# Patient Record
Sex: Female | Born: 1970 | Race: Black or African American | Hispanic: No | Marital: Married | Smoking: Never smoker
Health system: Southern US, Community
[De-identification: ages and names within clinical notes are randomized; demographics above are authoritative.]

## PROBLEM LIST (undated history)

## (undated) DIAGNOSIS — G5600 Carpal tunnel syndrome, unspecified upper limb: Secondary | ICD-10-CM

## (undated) DIAGNOSIS — C801 Malignant (primary) neoplasm, unspecified: Secondary | ICD-10-CM

## (undated) DIAGNOSIS — M1990 Unspecified osteoarthritis, unspecified site: Secondary | ICD-10-CM

## (undated) HISTORY — DX: Malignant (primary) neoplasm, unspecified: C80.1

## (undated) HISTORY — PX: BREAST SURGERY: SHX581

## (undated) HISTORY — DX: Carpal tunnel syndrome, unspecified upper limb: G56.00

## (undated) HISTORY — DX: Unspecified osteoarthritis, unspecified site: M19.90

---

## 2000-02-22 ENCOUNTER — Other Ambulatory Visit: Admission: RE | Admit: 2000-02-22 | Discharge: 2000-02-22 | Payer: Self-pay | Admitting: *Deleted

## 2001-03-13 ENCOUNTER — Other Ambulatory Visit: Admission: RE | Admit: 2001-03-13 | Discharge: 2001-03-13 | Payer: Self-pay | Admitting: *Deleted

## 2002-01-06 ENCOUNTER — Emergency Department (HOSPITAL_COMMUNITY): Admission: EM | Admit: 2002-01-06 | Discharge: 2002-01-06 | Payer: Self-pay | Admitting: Emergency Medicine

## 2002-01-06 ENCOUNTER — Encounter: Payer: Self-pay | Admitting: Emergency Medicine

## 2002-03-17 ENCOUNTER — Other Ambulatory Visit: Admission: RE | Admit: 2002-03-17 | Discharge: 2002-03-17 | Payer: Self-pay | Admitting: *Deleted

## 2003-03-19 ENCOUNTER — Other Ambulatory Visit: Admission: RE | Admit: 2003-03-19 | Discharge: 2003-03-19 | Payer: Self-pay | Admitting: *Deleted

## 2003-09-14 ENCOUNTER — Ambulatory Visit (HOSPITAL_COMMUNITY): Admission: RE | Admit: 2003-09-14 | Discharge: 2003-09-14 | Payer: Self-pay | Admitting: Family Medicine

## 2010-04-11 ENCOUNTER — Encounter: Admission: RE | Admit: 2010-04-11 | Discharge: 2010-04-11 | Payer: Self-pay | Admitting: Family Medicine

## 2013-01-19 ENCOUNTER — Other Ambulatory Visit: Payer: Self-pay

## 2013-01-19 DIAGNOSIS — Z1231 Encounter for screening mammogram for malignant neoplasm of breast: Secondary | ICD-10-CM

## 2013-02-13 ENCOUNTER — Ambulatory Visit
Admission: RE | Admit: 2013-02-13 | Discharge: 2013-02-13 | Disposition: A | Discharge status: Home / Self Care | Payer: BC Managed Care – PPO | Source: Ambulatory Visit

## 2013-02-13 DIAGNOSIS — Z1231 Encounter for screening mammogram for malignant neoplasm of breast: Secondary | ICD-10-CM

## 2014-01-12 ENCOUNTER — Other Ambulatory Visit: Payer: Self-pay

## 2014-01-12 DIAGNOSIS — Z1231 Encounter for screening mammogram for malignant neoplasm of breast: Secondary | ICD-10-CM

## 2014-02-19 ENCOUNTER — Ambulatory Visit
Admission: RE | Admit: 2014-02-19 | Discharge: 2014-02-19 | Disposition: A | Discharge status: Home / Self Care | Payer: BC Managed Care – PPO | Source: Ambulatory Visit

## 2014-02-19 DIAGNOSIS — Z1231 Encounter for screening mammogram for malignant neoplasm of breast: Secondary | ICD-10-CM

## 2015-01-18 ENCOUNTER — Other Ambulatory Visit: Payer: Self-pay

## 2015-01-18 DIAGNOSIS — Z1231 Encounter for screening mammogram for malignant neoplasm of breast: Secondary | ICD-10-CM

## 2015-02-21 ENCOUNTER — Ambulatory Visit
Admission: RE | Admit: 2015-02-21 | Discharge: 2015-02-21 | Disposition: A | Discharge status: Home / Self Care | Payer: BC Managed Care – PPO | Source: Ambulatory Visit

## 2015-02-21 DIAGNOSIS — Z1231 Encounter for screening mammogram for malignant neoplasm of breast: Secondary | ICD-10-CM

## 2016-01-30 ENCOUNTER — Other Ambulatory Visit: Payer: Self-pay | Admitting: Obstetrics & Gynecology

## 2016-01-30 DIAGNOSIS — Z1231 Encounter for screening mammogram for malignant neoplasm of breast: Secondary | ICD-10-CM

## 2016-02-23 ENCOUNTER — Ambulatory Visit
Admission: RE | Admit: 2016-02-23 | Discharge: 2016-02-23 | Disposition: A | Discharge status: Home / Self Care | Payer: BC Managed Care – PPO | Source: Ambulatory Visit | Attending: Obstetrics & Gynecology | Admitting: Obstetrics & Gynecology

## 2016-02-23 DIAGNOSIS — Z1231 Encounter for screening mammogram for malignant neoplasm of breast: Secondary | ICD-10-CM

## 2017-01-18 ENCOUNTER — Other Ambulatory Visit: Payer: Self-pay | Admitting: Obstetrics & Gynecology

## 2017-01-18 DIAGNOSIS — Z1231 Encounter for screening mammogram for malignant neoplasm of breast: Secondary | ICD-10-CM

## 2017-03-01 ENCOUNTER — Ambulatory Visit
Admission: RE | Admit: 2017-03-01 | Discharge: 2017-03-01 | Disposition: A | Discharge status: Home / Self Care | Payer: BC Managed Care – PPO | Source: Ambulatory Visit | Attending: Obstetrics & Gynecology | Admitting: Obstetrics & Gynecology

## 2017-03-01 DIAGNOSIS — Z1231 Encounter for screening mammogram for malignant neoplasm of breast: Secondary | ICD-10-CM

## 2017-06-05 ENCOUNTER — Other Ambulatory Visit: Payer: Self-pay

## 2017-06-10 ENCOUNTER — Other Ambulatory Visit: Payer: Self-pay | Admitting: *Deleted

## 2017-06-10 MED ORDER — ETONOGESTREL-ETHINYL ESTRADIOL 0.12-0.015 MG/24HR VA RING: 1.0000 | VAGINAL_RING | VAGINAL | 0 refills | Status: DC

## 2017-06-10 NOTE — Telephone Encounter (Signed)
Pt annual at [facility] on 05/01/2016, 1 ring sent. Needs annual exam

## 2017-06-12 ENCOUNTER — Other Ambulatory Visit: Payer: Self-pay

## 2017-06-12 MED ORDER — ETONOGESTREL-ETHINYL ESTRADIOL 0.12-0.015 MG/24HR VA RING: 1.0000 | VAGINAL_RING | VAGINAL | 4 refills | Status: DC

## 2017-06-12 NOTE — Telephone Encounter (Signed)
Requests 90 days supply per pharmacy fax.

## 2018-01-21 ENCOUNTER — Other Ambulatory Visit: Payer: Self-pay | Admitting: Obstetrics & Gynecology

## 2018-01-21 DIAGNOSIS — Z1231 Encounter for screening mammogram for malignant neoplasm of breast: Secondary | ICD-10-CM

## 2018-03-03 ENCOUNTER — Ambulatory Visit
Admission: RE | Admit: 2018-03-03 | Discharge: 2018-03-03 | Disposition: A | Discharge status: Home / Self Care | Payer: BC Managed Care – PPO | Source: Ambulatory Visit | Attending: Obstetrics & Gynecology | Admitting: Obstetrics & Gynecology

## 2018-03-03 DIAGNOSIS — Z1231 Encounter for screening mammogram for malignant neoplasm of breast: Secondary | ICD-10-CM

## 2018-03-04 ENCOUNTER — Other Ambulatory Visit: Payer: Self-pay | Admitting: Obstetrics & Gynecology

## 2018-03-04 DIAGNOSIS — R928 Other abnormal and inconclusive findings on diagnostic imaging of breast: Secondary | ICD-10-CM

## 2018-03-14 ENCOUNTER — Ambulatory Visit
Admission: RE | Admit: 2018-03-14 | Discharge: 2018-03-14 | Disposition: A | Discharge status: Home / Self Care | Payer: BC Managed Care – PPO | Source: Ambulatory Visit | Attending: Obstetrics & Gynecology | Admitting: Obstetrics & Gynecology

## 2018-03-14 ENCOUNTER — Other Ambulatory Visit: Payer: Self-pay | Admitting: Obstetrics & Gynecology

## 2018-03-14 DIAGNOSIS — R928 Other abnormal and inconclusive findings on diagnostic imaging of breast: Secondary | ICD-10-CM

## 2018-03-14 DIAGNOSIS — N6310 Unspecified lump in the right breast, unspecified quadrant: Secondary | ICD-10-CM

## 2018-03-28 ENCOUNTER — Ambulatory Visit
Admission: RE | Admit: 2018-03-28 | Discharge: 2018-03-28 | Disposition: A | Discharge status: Home / Self Care | Payer: BC Managed Care – PPO | Source: Ambulatory Visit | Attending: Obstetrics & Gynecology | Admitting: Obstetrics & Gynecology

## 2018-03-28 DIAGNOSIS — N6310 Unspecified lump in the right breast, unspecified quadrant: Secondary | ICD-10-CM

## 2018-03-28 HISTORY — PX: BREAST BIOPSY: SHX20

## 2018-04-23 ENCOUNTER — Encounter: Payer: Self-pay | Admitting: Obstetrics & Gynecology

## 2018-04-23 ENCOUNTER — Ambulatory Visit: Payer: BC Managed Care – PPO | Admitting: Obstetrics & Gynecology

## 2018-04-23 VITALS — BP 128/84 | Ht 61.5 in | Wt 181.0 lb

## 2018-04-23 DIAGNOSIS — Z6833 Body mass index (BMI) 33.0-33.9, adult: Secondary | ICD-10-CM

## 2018-04-23 DIAGNOSIS — Z3044 Encounter for surveillance of vaginal ring hormonal contraceptive device: Secondary | ICD-10-CM

## 2018-04-23 DIAGNOSIS — E6609 Other obesity due to excess calories: Secondary | ICD-10-CM | POA: Diagnosis not present

## 2018-04-23 DIAGNOSIS — Z01419 Encounter for gynecological examination (general) (routine) without abnormal findings: Secondary | ICD-10-CM

## 2018-04-23 MED ORDER — ETONOGESTREL-ETHINYL ESTRADIOL 0.12-0.015 MG/24HR VA RING: 1.0000 | VAGINAL_RING | VAGINAL | 4 refills | Status: DC

## 2018-04-23 NOTE — Progress Notes (Signed)
Leslie Mccormick 1971-07-01 [id number]   History:    47 y.o. G0 Single.  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  RP:  Established patient presenting for annual gyn exam   HPI:  Well on Nuvaring.  No BTB.  No pelvic pain.  Abstinent.  Breast normal.  Had a right breast biopsy March 28, 2018 that came back benign fibrocystic changes.  Urine and bowel movements normal.  Body mass index 33.65.  Exercises regularly.  Health labs with family physician.  Past medical history,surgical history, family history and social history were all reviewed and documented in the EPIC chart.  Gynecologic History Patient's last menstrual period was 03/20/2018. Contraception: abstinence and NuvaRing vaginal inserts Last Pap: 2017-2018 [facility]. Results were: Normal per patient Last mammogram: 02/2018. Results were: abnormal.  Rt breast Bx benign fibrocystic changes 03/28/2018 Bone Density: Never Colonoscopy: Never  Obstetric History OB History  Gravida Para Term Preterm AB Living  0 0 0 0 0 0  SAB TAB Ectopic Multiple Live Births  0 0 0 0 0     ROS: A ROS was performed and pertinent positives and negatives are included in the history.  GENERAL: No fevers or chills. HEENT: No change in vision, no earache, sore throat or sinus congestion. NECK: No pain or stiffness. CARDIOVASCULAR: No chest pain or pressure. No palpitations. PULMONARY: No shortness of breath, cough or wheeze. GASTROINTESTINAL: No abdominal pain, nausea, vomiting or diarrhea, melena or bright red blood per rectum. GENITOURINARY: No urinary frequency, urgency, hesitancy or dysuria. MUSCULOSKELETAL: No joint or muscle pain, no back pain, no recent trauma. DERMATOLOGIC: No rash, no itching, no lesions. ENDOCRINE: No polyuria, polydipsia, no heat or cold intolerance. No recent change in weight. HEMATOLOGICAL: No anemia or easy bruising or bleeding. NEUROLOGIC: No headache, seizures, numbness, tingling or weakness.  PSYCHIATRIC: No depression, no loss of interest in normal activity or change in sleep pattern.     Exam:   BP 128/84   Ht 5' 1.5" (1.562 m)   Wt 181 lb (82.1 kg)   LMP 03/20/2018 Comment: nuvaring  BMI 33.65 kg/m   Body mass index is 33.65 kg/m.  General appearance : Well developed well nourished female. No acute distress HEENT: Eyes: no retinal hemorrhage or exudates,  Neck supple, trachea midline, no carotid bruits, no thyroidmegaly Lungs: Clear to auscultation, no rhonchi or wheezes, or rib retractions  Heart: Regular rate and rhythm, no murmurs or gallops Breast:Examined in sitting and supine position were symmetrical in appearance, no palpable masses or tenderness,  no skin retraction, no nipple inversion, no nipple discharge, no skin discoloration, no axillary or supraclavicular lymphadenopathy Abdomen: no palpable masses or tenderness, no rebound or guarding Extremities: no edema or skin discoloration or tenderness  Pelvic: Vulva: Normal             Vagina: No gross lesions or discharge  Cervix: No gross lesions or discharge.  Pap reflex done  Uterus  AV, normal size, shape and consistency, non-tender and mobile  Adnexa  Without masses or tenderness  Anus: Normal   Assessment/Plan:  47 y.o. female for annual exam   1. Encounter for routine gynecological examination with Papanicolaou smear of cervix Normal gynecologic exam.  Pap reflex done.  Breast exam normal.  Right breast biopsy on March 28, 2018 showed benign fibrocystic changes.  Health labs with family physician.  2. Encounter for surveillance of vaginal ring hormonal contraceptive device Well on NuvaRing.  No contraindication.  NuvaRing  represcribed.     3. Class 1 obesity due to excess calories without serious comorbidity with body mass index (BMI) of 33.0 to 33.9 in adult Low calorie/carb diet such as [employer] recommended.  Continue with aerobic physical activities 5 times a week and weightlifting  every 2 days.  Other orders - etonogestrel-ethinyl estradiol (NUVARING) 0.12-0.015 MG/24HR vaginal ring; Place 1 each vaginally every 28 (twenty-eight) days. Insert vaginally and leave in place for 3 consecutive weeks, then remove for 1 week.  [name] MD, 3:11 PM 04/23/2018

## 2018-04-23 NOTE — Patient Instructions (Signed)
1. Encounter for routine gynecological examination with Papanicolaou smear of cervix Normal gynecologic exam.  Pap reflex done.  Breast exam normal.  Right breast biopsy on March 28, 2018 showed benign fibrocystic changes.  Health labs with family physician.  2. Encounter for surveillance of vaginal ring hormonal contraceptive device Well on NuvaRing.  No contraindication.  NuvaRing represcribed.     3. Class 1 obesity due to excess calories without serious comorbidity with body mass index (BMI) of 33.0 to 33.9 in adult Low calorie/carb diet such as [employer] recommended.  Continue with aerobic physical activities 5 times a week and weightlifting every 2 days.  Other orders - etonogestrel-ethinyl estradiol (NUVARING) 0.12-0.015 MG/24HR vaginal ring; Place 1 each vaginally every 28 (twenty-eight) days. Insert vaginally and leave in place for 3 consecutive weeks, then remove for 1 week.  Leslie Mccormick, it was a pleasure seeing you today!  I will inform you of your results as soon as they are available.  All my congratulations on your doctorate.

## 2018-04-23 NOTE — Addendum Note (Signed)
Addended by: Thurnell Garbe A on: 04/23/2018 04:29 PM   Modules accepted: Orders

## 2018-04-25 LAB — PAP IG W/ RFLX HPV ASCU

## 2019-01-22 ENCOUNTER — Other Ambulatory Visit: Payer: Self-pay | Admitting: Obstetrics & Gynecology

## 2019-01-22 DIAGNOSIS — Z1231 Encounter for screening mammogram for malignant neoplasm of breast: Secondary | ICD-10-CM

## 2019-03-06 ENCOUNTER — Ambulatory Visit
Admission: RE | Admit: 2019-03-06 | Discharge: 2019-03-06 | Disposition: A | Discharge status: Home / Self Care | Payer: BC Managed Care – PPO | Source: Ambulatory Visit | Attending: Obstetrics & Gynecology | Admitting: Obstetrics & Gynecology

## 2019-03-06 ENCOUNTER — Other Ambulatory Visit: Payer: Self-pay

## 2019-03-06 DIAGNOSIS — Z1231 Encounter for screening mammogram for malignant neoplasm of breast: Secondary | ICD-10-CM

## 2019-04-09 ENCOUNTER — Telehealth: Payer: Self-pay | Admitting: *Deleted

## 2019-04-09 NOTE — Telephone Encounter (Signed)
Patient called stating her nuvaring is $167 and she doesn't want to pay this price. After discussing with patient it appears she wants the brand and not the generic ring. I explained to her I can send in the brand nuvaring,however I can't make the price cheaper she will need to discuss with the insurance company. Patient verbalized she understood.

## 2019-04-25 ENCOUNTER — Other Ambulatory Visit: Payer: Self-pay | Admitting: Obstetrics & Gynecology

## 2019-04-27 ENCOUNTER — Other Ambulatory Visit: Payer: Self-pay

## 2019-04-27 MED ORDER — NUVARING 0.12-0.015 MG/24HR VA RING: VAGINAL_RING | VAGINAL | 0 refills | Status: DC

## 2019-04-29 ENCOUNTER — Encounter: Payer: Self-pay | Admitting: Obstetrics & Gynecology

## 2019-04-29 ENCOUNTER — Ambulatory Visit (INDEPENDENT_AMBULATORY_CARE_PROVIDER_SITE_OTHER): Payer: BC Managed Care – PPO | Admitting: Obstetrics & Gynecology

## 2019-04-29 ENCOUNTER — Other Ambulatory Visit: Payer: Self-pay

## 2019-04-29 VITALS — BP 120/72 | Ht 62.0 in | Wt 186.0 lb

## 2019-04-29 DIAGNOSIS — Z6834 Body mass index (BMI) 34.0-34.9, adult: Secondary | ICD-10-CM

## 2019-04-29 DIAGNOSIS — Z01419 Encounter for gynecological examination (general) (routine) without abnormal findings: Secondary | ICD-10-CM

## 2019-04-29 DIAGNOSIS — E6609 Other obesity due to excess calories: Secondary | ICD-10-CM

## 2019-04-29 DIAGNOSIS — Z3044 Encounter for surveillance of vaginal ring hormonal contraceptive device: Secondary | ICD-10-CM | POA: Diagnosis not present

## 2019-04-29 DIAGNOSIS — E66811 Obesity, class 1: Secondary | ICD-10-CM

## 2019-04-29 NOTE — Progress Notes (Signed)
Leslie Mccormick February 01, 1971 [id number]   History:    48 y.o. G0 Single.  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  RP:  Established patient presenting for annual gyn exam   HPI: Well on Nuvaring, but expensive.  Light menses.  No pelvic pain.  Abstinent.  Urine/BMs normal.  Breasts normal.  BMI 34.02.  Exercising regularly.  Health Labs with Fam MD.  Past medical history,surgical history, family history and social history were all reviewed and documented in the EPIC chart.  Gynecologic History No LMP recorded. (Menstrual status: Other). Contraception: NuvaRing vaginal inserts Last Pap: 04/2018. Results were: Negative Last mammogram: 02/2019 Negative Bone Density: Never Colonoscopy: Never  Obstetric History OB History  Gravida Para Term Preterm AB Living  0 0 0 0 0 0  SAB TAB Ectopic Multiple Live Births  0 0 0 0 0     ROS: A ROS was performed and pertinent positives and negatives are included in the history.  GENERAL: No fevers or chills. HEENT: No change in vision, no earache, sore throat or sinus congestion. NECK: No pain or stiffness. CARDIOVASCULAR: No chest pain or pressure. No palpitations. PULMONARY: No shortness of breath, cough or wheeze. GASTROINTESTINAL: No abdominal pain, nausea, vomiting or diarrhea, melena or bright red blood per rectum. GENITOURINARY: No urinary frequency, urgency, hesitancy or dysuria. MUSCULOSKELETAL: No joint or muscle pain, no back pain, no recent trauma. DERMATOLOGIC: No rash, no itching, no lesions. ENDOCRINE: No polyuria, polydipsia, no heat or cold intolerance. No recent change in weight. HEMATOLOGICAL: No anemia or easy bruising or bleeding. NEUROLOGIC: No headache, seizures, numbness, tingling or weakness. PSYCHIATRIC: No depression, no loss of interest in normal activity or change in sleep pattern.     Exam:   BP 120/72   Ht 5\' 2"  (1.575 m)   Wt 186 lb (84.4 kg)   BMI 34.02 kg/m   Body mass index is 34.02 kg/m.  General  appearance : Well developed well nourished female. No acute distress HEENT: Eyes: no retinal hemorrhage or exudates,  Neck supple, trachea midline, no carotid bruits, no thyroidmegaly Lungs: Clear to auscultation, no rhonchi or wheezes, or rib retractions  Heart: Regular rate and rhythm, no murmurs or gallops Breast:Examined in sitting and supine position were symmetrical in appearance, no palpable masses or tenderness,  no skin retraction, no nipple inversion, no nipple discharge, no skin discoloration, no axillary or supraclavicular lymphadenopathy Abdomen: no palpable masses or tenderness, no rebound or guarding Extremities: no edema or skin discoloration or tenderness  Pelvic: Vulva: Normal             Vagina: No gross lesions or discharge  Cervix: No gross lesions or discharge.  Pap reflex done.  Uterus  AV, normal size, shape and consistency, non-tender and mobile  Adnexa  Without masses or tenderness  Anus: Normal   Assessment/Plan:  48 y.o. female for annual exam   1. Encounter for routine gynecological examination with Papanicolaou smear of cervix Normal gynecologic exam.  Pap reflex done.  Breast exam normal.  Screening mammogram August 2020 was negative.  Health labs with family physician.  2. Encounter for surveillance of vaginal ring hormonal contraceptive device Well on NuvaRing.  Patient will try to change pharmacy because of high cost.  No contraindication to continue on NuvaRing.  Prescription already sent for the year.  3. Class 1 obesity due to excess calories without serious comorbidity with body mass index (BMI) of 34.0 to 34.9 in adult Recommend a lower calorie/carb diet such  as [employer].  Aerobic physical activities 5 times a week and weightlifting every 2 days.  Other orders - cholecalciferol (VITAMIN D3) 25 MCG (1000 UT) tablet; Take 1,000 Units by mouth daily.  [name] MD, 3:08 PM 04/29/2019

## 2019-04-30 ENCOUNTER — Encounter: Payer: Self-pay | Admitting: Obstetrics & Gynecology

## 2019-04-30 NOTE — Patient Instructions (Signed)
1. Encounter for routine gynecological examination with Papanicolaou smear of cervix Normal gynecologic exam.  Pap reflex done.  Breast exam normal.  Screening mammogram August 2020 was negative.  Health labs with family physician.  2. Encounter for surveillance of vaginal ring hormonal contraceptive device Well on NuvaRing.  Patient will try to change pharmacy because of high cost.  No contraindication to continue on NuvaRing.  Prescription already sent for the year.  3. Class 1 obesity due to excess calories without serious comorbidity with body mass index (BMI) of 34.0 to 34.9 in adult Recommend a lower calorie/carb diet such as [employer].  Aerobic physical activities 5 times a week and weightlifting every 2 days.  Other orders - cholecalciferol (VITAMIN D3) 25 MCG (1000 UT) tablet; Take 1,000 Units by mouth daily.  [place], it was a pleasure seeing you today!  I will inform you of your results as soon as they are available.

## 2019-05-05 LAB — PAP IG W/ RFLX HPV ASCU

## 2019-05-05 LAB — HUMAN PAPILLOMAVIRUS, HIGH RISK: HPV DNA High Risk: NOT DETECTED

## 2019-07-27 ENCOUNTER — Telehealth: Payer: Self-pay | Admitting: *Deleted

## 2019-07-27 NOTE — Telephone Encounter (Signed)
PA done via cover my meds for brand nuvaring 0.12-0.015 mg ring, will wait for response from [facility].

## 2019-07-28 NOTE — Telephone Encounter (Signed)
[facility] denied Rx for brand nuvaring per plan must try and fail both generic nuvaring and Annovera.  Pharmacy informed as well.

## 2019-10-23 ENCOUNTER — Other Ambulatory Visit: Payer: Self-pay | Admitting: Obstetrics & Gynecology

## 2020-01-26 ENCOUNTER — Other Ambulatory Visit: Payer: Self-pay | Admitting: Obstetrics & Gynecology

## 2020-01-26 DIAGNOSIS — Z1231 Encounter for screening mammogram for malignant neoplasm of breast: Secondary | ICD-10-CM

## 2020-03-11 ENCOUNTER — Ambulatory Visit: Payer: BC Managed Care – PPO

## 2020-03-14 ENCOUNTER — Other Ambulatory Visit: Payer: Self-pay

## 2020-03-14 MED ORDER — NUVARING 0.12-0.015 MG/24HR VA RING: VAGINAL_RING | VAGINAL | 2 refills | Status: DC

## 2020-04-29 ENCOUNTER — Other Ambulatory Visit: Payer: Self-pay

## 2020-04-29 ENCOUNTER — Ambulatory Visit (INDEPENDENT_AMBULATORY_CARE_PROVIDER_SITE_OTHER): Payer: BC Managed Care – PPO | Admitting: Obstetrics & Gynecology

## 2020-04-29 ENCOUNTER — Encounter: Payer: Self-pay | Admitting: Obstetrics & Gynecology

## 2020-04-29 VITALS — BP 134/80 | Ht 62.0 in | Wt 176.0 lb

## 2020-04-29 DIAGNOSIS — Z3044 Encounter for surveillance of vaginal ring hormonal contraceptive device: Secondary | ICD-10-CM

## 2020-04-29 DIAGNOSIS — E6609 Other obesity due to excess calories: Secondary | ICD-10-CM

## 2020-04-29 DIAGNOSIS — Z01419 Encounter for gynecological examination (general) (routine) without abnormal findings: Secondary | ICD-10-CM | POA: Diagnosis not present

## 2020-04-29 DIAGNOSIS — R87610 Atypical squamous cells of undetermined significance on cytologic smear of cervix (ASC-US): Secondary | ICD-10-CM | POA: Diagnosis not present

## 2020-04-29 DIAGNOSIS — Z6832 Body mass index (BMI) 32.0-32.9, adult: Secondary | ICD-10-CM

## 2020-04-29 MED ORDER — NUVARING 0.12-0.015 MG/24HR VA RING
VAGINAL_RING | VAGINAL | 4 refills | Status: DC
Start: 2020-04-29 — End: 2020-10-05

## 2020-04-29 NOTE — Progress Notes (Signed)
Leslie Mccormick [birth year] [id number]   History:    49 y.o. G0 Married.  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  RP:  Established patient presenting for annual gyn exam   HPI: Well on Nuvaring continuously q4 wks.  No BTB.  No pelvic pain.  Abstinent.  Urine/BMs normal.  Intolerance to Gluten.  Breasts normal.  BMI 32.19.  Exercising regularly.  Health Labs with Fam MD.  Past medical history,surgical history, family history and social history were all reviewed and documented in the EPIC chart.  Gynecologic History No LMP recorded (lmp unknown).  Obstetric History OB History  Gravida Para Term Preterm AB Living  0 0 0 0 0 0  SAB TAB Ectopic Multiple Live Births  0 0 0 0 0     ROS: A ROS was performed and pertinent positives and negatives are included in the history. GENERAL: No fevers or chills. HEENT: No change in vision, no earache, sore throat or sinus congestion. NECK: No pain or stiffness. CARDIOVASCULAR: No chest pain or pressure. No palpitations. PULMONARY: No shortness of breath, cough or wheeze. GASTROINTESTINAL: No abdominal pain, nausea, vomiting or diarrhea, melena or bright red blood per rectum. GENITOURINARY: No urinary frequency, urgency, hesitancy or dysuria. MUSCULOSKELETAL: No joint or muscle pain, no back pain, no recent trauma. DERMATOLOGIC: No rash, no itching, no lesions. ENDOCRINE: No polyuria, polydipsia, no heat or cold intolerance. No recent change in weight. HEMATOLOGICAL: No anemia or easy bruising or bleeding. NEUROLOGIC: No headache, seizures, numbness, tingling or weakness. PSYCHIATRIC: No depression, no loss of interest in normal activity or change in sleep pattern.     Exam:   BP 134/80 (BP Location: Right Arm, Patient Position: Sitting, Cuff Size: Normal)   Ht 5\' 2"  (1.575 m)   Wt 176 lb (79.8 kg)   LMP  (LMP Unknown)   BMI 32.19 kg/m   Body mass index is 32.19 kg/m.  General appearance : Well developed well nourished female.  No acute distress HEENT: Eyes: no retinal hemorrhage or exudates,  Neck supple, trachea midline, no carotid bruits, no thyroidmegaly Lungs: Clear to auscultation, no rhonchi or wheezes, or rib retractions  Heart: Regular rate and rhythm, no murmurs or gallops Breast:Examined in sitting and supine position were symmetrical in appearance, no palpable masses or tenderness,  no skin retraction, no nipple inversion, no nipple discharge, no skin discoloration, no axillary or supraclavicular lymphadenopathy Abdomen: no palpable masses or tenderness, no rebound or guarding Extremities: no edema or skin discoloration or tenderness  Pelvic: Vulva: Normal             Vagina: No gross lesions or discharge  Cervix: No gross lesions or discharge.  Pap reflex done.  Uterus  AV, normal size, shape and consistency, non-tender and mobile  Adnexa  Without masses or tenderness  Anus: Normal   Assessment/Plan:  49 y.o. female for annual exam   1. Encounter for routine gynecological examination with Papanicolaou smear of cervix Normal gynecologic exam.  Pap reflex done.  Breast exam normal.  Screening mammogram schedule October 2021.  Health labs with family physician.  2. ASCUS of cervix with negative high risk HPV Pap reflex done today.  3. Encounter for surveillance of vaginal ring hormonal contraceptive device Well on NuvaRing continuously.  No contraindication.  Prescription sent to pharmacy.  4. Class 1 obesity due to excess calories without serious comorbidity with body mass index (BMI) of 32.0 to 32.9 in adult Recommend a lower calorie/carb diet.  Continue with fitness and weight lifting.  Other orders - NUVARING 0.12-0.015 MG/24HR vaginal ring; Insert vaginally and leave in place for 4 consecutive weeks, then switch Ring.  [name] MD, 3:59 PM 04/29/2020

## 2020-05-02 NOTE — Addendum Note (Signed)
Addended by: Lorine Bears on: 05/02/2020 09:56 AM   Modules accepted: Orders

## 2020-05-04 LAB — PAP IG W/ RFLX HPV ASCU

## 2020-05-16 ENCOUNTER — Other Ambulatory Visit: Payer: Self-pay

## 2020-05-16 ENCOUNTER — Ambulatory Visit
Admission: RE | Admit: 2020-05-16 | Discharge: 2020-05-16 | Disposition: A | Discharge status: Home / Self Care | Payer: BC Managed Care – PPO | Source: Ambulatory Visit | Attending: Obstetrics & Gynecology | Admitting: Obstetrics & Gynecology

## 2020-05-16 DIAGNOSIS — Z1231 Encounter for screening mammogram for malignant neoplasm of breast: Secondary | ICD-10-CM

## 2020-05-18 ENCOUNTER — Other Ambulatory Visit: Payer: Self-pay | Admitting: Obstetrics & Gynecology

## 2020-05-18 DIAGNOSIS — R928 Other abnormal and inconclusive findings on diagnostic imaging of breast: Secondary | ICD-10-CM

## 2020-06-01 ENCOUNTER — Ambulatory Visit
Admission: RE | Admit: 2020-06-01 | Discharge: 2020-06-01 | Disposition: A | Discharge status: Home / Self Care | Payer: BC Managed Care – PPO | Source: Ambulatory Visit | Attending: Obstetrics & Gynecology | Admitting: Obstetrics & Gynecology

## 2020-06-01 ENCOUNTER — Other Ambulatory Visit: Payer: Self-pay

## 2020-06-01 DIAGNOSIS — R928 Other abnormal and inconclusive findings on diagnostic imaging of breast: Secondary | ICD-10-CM

## 2020-07-11 ENCOUNTER — Telehealth: Payer: Self-pay

## 2020-07-11 NOTE — Telephone Encounter (Signed)
Patient called. She had seen Dr. Marlou Starks today in regards to a breast mass that has enlarged since last year. On diagnostic breast studies it was "suspicious" and the radiology recommended excision of the entire mass.  Patient saw Dr. Marlou Starks and he also recommended excision of the entire mass.  Patient was trying to avoid surgery and asked him for options that included:  MRI now, Wait 6 mos and recheck with imaging, Biopsy now.  Patient wanted me to ask Dr. Marguerita Merles what she would recommend. I told her since the  Mass had enlarged and both MD's recommended removal of the entire mass that I felt certain Dr. Marguerita Merles would be in agreement with them. I encouraged her not to be afraid of surgery and to consider.

## 2020-07-12 ENCOUNTER — Ambulatory Visit: Payer: Self-pay | Admitting: General Surgery

## 2020-07-12 DIAGNOSIS — N6310 Unspecified lump in the right breast, unspecified quadrant: Secondary | ICD-10-CM

## 2020-07-12 NOTE — Telephone Encounter (Signed)
Spoke with patient and advised. She said after we talked yesterday she called the surgeon's office and is in process of arranging surgery.

## 2020-07-12 NOTE — Telephone Encounter (Signed)
Yes, I strongly recommend removal of the mass now.

## 2020-07-12 NOTE — Telephone Encounter (Signed)
Left message for patient to call.

## 2020-07-26 ENCOUNTER — Other Ambulatory Visit: Payer: Self-pay | Admitting: General Surgery

## 2020-07-26 DIAGNOSIS — N6310 Unspecified lump in the right breast, unspecified quadrant: Secondary | ICD-10-CM

## 2020-09-08 ENCOUNTER — Encounter (HOSPITAL_BASED_OUTPATIENT_CLINIC_OR_DEPARTMENT_OTHER): Payer: Self-pay | Admitting: General Surgery

## 2020-09-08 ENCOUNTER — Other Ambulatory Visit: Payer: Self-pay

## 2020-09-10 ENCOUNTER — Other Ambulatory Visit (HOSPITAL_COMMUNITY)
Admission: RE | Admit: 2020-09-10 | Discharge: 2020-09-10 | Disposition: A | Discharge status: Home / Self Care | Payer: BC Managed Care – PPO | Source: Ambulatory Visit | Attending: General Surgery | Admitting: General Surgery

## 2020-09-10 DIAGNOSIS — Z01812 Encounter for preprocedural laboratory examination: Secondary | ICD-10-CM | POA: Insufficient documentation

## 2020-09-10 DIAGNOSIS — Z20822 Contact with and (suspected) exposure to covid-19: Secondary | ICD-10-CM | POA: Insufficient documentation

## 2020-09-10 LAB — SARS CORONAVIRUS 2 (TAT 6-24 HRS): SARS Coronavirus 2: NEGATIVE

## 2020-09-13 ENCOUNTER — Ambulatory Visit
Admission: RE | Admit: 2020-09-13 | Discharge: 2020-09-13 | Disposition: A | Discharge status: Home / Self Care | Payer: BC Managed Care – PPO | Source: Ambulatory Visit | Attending: General Surgery | Admitting: General Surgery

## 2020-09-13 ENCOUNTER — Other Ambulatory Visit: Payer: Self-pay

## 2020-09-13 DIAGNOSIS — N6310 Unspecified lump in the right breast, unspecified quadrant: Secondary | ICD-10-CM

## 2020-09-13 NOTE — Progress Notes (Signed)

## 2020-09-14 ENCOUNTER — Ambulatory Visit (HOSPITAL_BASED_OUTPATIENT_CLINIC_OR_DEPARTMENT_OTHER)
Admission: RE | Admit: 2020-09-14 | Discharge: 2020-09-14 | Disposition: A | Discharge status: Home / Self Care | Payer: BC Managed Care – PPO | Attending: General Surgery | Admitting: General Surgery

## 2020-09-14 ENCOUNTER — Encounter (HOSPITAL_BASED_OUTPATIENT_CLINIC_OR_DEPARTMENT_OTHER): Payer: Self-pay | Admitting: General Surgery

## 2020-09-14 ENCOUNTER — Ambulatory Visit (HOSPITAL_BASED_OUTPATIENT_CLINIC_OR_DEPARTMENT_OTHER): Payer: BC Managed Care – PPO | Admitting: Anesthesiology

## 2020-09-14 ENCOUNTER — Encounter (HOSPITAL_BASED_OUTPATIENT_CLINIC_OR_DEPARTMENT_OTHER)
Admission: RE | Disposition: A | Discharge status: Home / Self Care | Payer: Self-pay | Source: Home / Self Care | Attending: General Surgery

## 2020-09-14 ENCOUNTER — Ambulatory Visit
Admission: RE | Admit: 2020-09-14 | Discharge: 2020-09-14 | Disposition: A | Discharge status: Home / Self Care | Payer: BC Managed Care – PPO | Source: Ambulatory Visit | Attending: General Surgery | Admitting: General Surgery

## 2020-09-14 DIAGNOSIS — Z793 Long term (current) use of hormonal contraceptives: Secondary | ICD-10-CM | POA: Insufficient documentation

## 2020-09-14 DIAGNOSIS — Z8041 Family history of malignant neoplasm of ovary: Secondary | ICD-10-CM | POA: Diagnosis not present

## 2020-09-14 DIAGNOSIS — D241 Benign neoplasm of right breast: Secondary | ICD-10-CM | POA: Diagnosis not present

## 2020-09-14 DIAGNOSIS — N6310 Unspecified lump in the right breast, unspecified quadrant: Secondary | ICD-10-CM | POA: Diagnosis present

## 2020-09-14 DIAGNOSIS — Z886 Allergy status to analgesic agent status: Secondary | ICD-10-CM | POA: Insufficient documentation

## 2020-09-14 DIAGNOSIS — Z803 Family history of malignant neoplasm of breast: Secondary | ICD-10-CM | POA: Insufficient documentation

## 2020-09-14 DIAGNOSIS — Z90721 Acquired absence of ovaries, unilateral: Secondary | ICD-10-CM | POA: Insufficient documentation

## 2020-09-14 HISTORY — PX: BREAST LUMPECTOMY WITH RADIOACTIVE SEED LOCALIZATION: SHX6424

## 2020-09-14 LAB — POCT PREGNANCY, URINE: Preg Test, Ur: NEGATIVE

## 2020-09-14 SURGERY — BREAST LUMPECTOMY WITH RADIOACTIVE SEED LOCALIZATION
Anesthesia: General | Site: Breast | Laterality: Right

## 2020-09-14 MED ORDER — FENTANYL CITRATE (PF) 100 MCG/2ML IJ SOLN: 25.0000 ug | INTRAMUSCULAR | Status: DC | PRN

## 2020-09-14 MED ORDER — OXYCODONE HCL 5 MG PO TABS
5.0000 mg | ORAL_TABLET | Freq: Once | ORAL | Status: AC | PRN
  Administered 2020-09-14: 5 mg via ORAL

## 2020-09-14 MED ORDER — CEFAZOLIN SODIUM-DEXTROSE 2-4 GM/100ML-% IV SOLN
INTRAVENOUS | Status: AC
  Filled 2020-09-14: qty 100

## 2020-09-14 MED ORDER — GABAPENTIN 300 MG PO CAPS
300.0000 mg | ORAL_CAPSULE | ORAL | Status: AC
  Administered 2020-09-14: 300 mg via ORAL

## 2020-09-14 MED ORDER — OXYCODONE HCL 5 MG PO TABS
ORAL_TABLET | ORAL | Status: AC
  Filled 2020-09-14: qty 1

## 2020-09-14 MED ORDER — LIDOCAINE 2% (20 MG/ML) 5 ML SYRINGE
INTRAMUSCULAR | Status: DC | PRN
  Administered 2020-09-14: 40 mg via INTRAVENOUS

## 2020-09-14 MED ORDER — ACETAMINOPHEN 500 MG PO TABS
1000.0000 mg | ORAL_TABLET | ORAL | Status: AC
  Administered 2020-09-14: 1000 mg via ORAL

## 2020-09-14 MED ORDER — CEFAZOLIN SODIUM-DEXTROSE 2-4 GM/100ML-% IV SOLN
2.0000 g | INTRAVENOUS | Status: AC
  Administered 2020-09-14: 2 g via INTRAVENOUS

## 2020-09-14 MED ORDER — GABAPENTIN 300 MG PO CAPS
ORAL_CAPSULE | ORAL | Status: AC
  Filled 2020-09-14: qty 1

## 2020-09-14 MED ORDER — MIDAZOLAM HCL 5 MG/5ML IJ SOLN
INTRAMUSCULAR | Status: DC | PRN
  Administered 2020-09-14: 2 mg via INTRAVENOUS

## 2020-09-14 MED ORDER — OXYCODONE HCL 5 MG/5ML PO SOLN: 5.0000 mg | Freq: Once | ORAL | Status: AC | PRN

## 2020-09-14 MED ORDER — LIDOCAINE 2% (20 MG/ML) 5 ML SYRINGE
INTRAMUSCULAR | Status: AC
  Filled 2020-09-14: qty 5

## 2020-09-14 MED ORDER — DEXAMETHASONE SODIUM PHOSPHATE 4 MG/ML IJ SOLN
INTRAMUSCULAR | Status: DC | PRN
  Administered 2020-09-14: 8 mg via INTRAVENOUS

## 2020-09-14 MED ORDER — HYDROCODONE-ACETAMINOPHEN 5-325 MG PO TABS: 1.0000 | ORAL_TABLET | Freq: Four times a day (QID) | ORAL | 0 refills | Status: DC | PRN

## 2020-09-14 MED ORDER — ONDANSETRON HCL 4 MG/2ML IJ SOLN
INTRAMUSCULAR | Status: DC | PRN
  Administered 2020-09-14: 4 mg via INTRAVENOUS

## 2020-09-14 MED ORDER — PROPOFOL 10 MG/ML IV BOLUS
INTRAVENOUS | Status: DC | PRN
  Administered 2020-09-14: 180 mg via INTRAVENOUS

## 2020-09-14 MED ORDER — CELECOXIB 200 MG PO CAPS
200.0000 mg | ORAL_CAPSULE | ORAL | Status: AC
  Administered 2020-09-14: 200 mg via ORAL

## 2020-09-14 MED ORDER — PROPOFOL 10 MG/ML IV BOLUS
INTRAVENOUS | Status: AC
  Filled 2020-09-14: qty 20

## 2020-09-14 MED ORDER — LACTATED RINGERS IV SOLN: INTRAVENOUS | Status: DC

## 2020-09-14 MED ORDER — ACETAMINOPHEN 500 MG PO TABS
ORAL_TABLET | ORAL | Status: AC
  Filled 2020-09-14: qty 2

## 2020-09-14 MED ORDER — FENTANYL CITRATE (PF) 100 MCG/2ML IJ SOLN
INTRAMUSCULAR | Status: AC
  Filled 2020-09-14: qty 2

## 2020-09-14 MED ORDER — ONDANSETRON HCL 4 MG/2ML IJ SOLN: 4.0000 mg | Freq: Four times a day (QID) | INTRAMUSCULAR | Status: DC | PRN

## 2020-09-14 MED ORDER — DEXAMETHASONE SODIUM PHOSPHATE 10 MG/ML IJ SOLN
INTRAMUSCULAR | Status: AC
  Filled 2020-09-14: qty 1

## 2020-09-14 MED ORDER — CHLORHEXIDINE GLUCONATE CLOTH 2 % EX PADS: 6.0000 | MEDICATED_PAD | Freq: Once | CUTANEOUS | Status: DC

## 2020-09-14 MED ORDER — MIDAZOLAM HCL 2 MG/2ML IJ SOLN
INTRAMUSCULAR | Status: AC
  Filled 2020-09-14: qty 2

## 2020-09-14 MED ORDER — FENTANYL CITRATE (PF) 100 MCG/2ML IJ SOLN
INTRAMUSCULAR | Status: DC | PRN
  Administered 2020-09-14 (×3): 25 ug via INTRAVENOUS
  Administered 2020-09-14: 50 ug via INTRAVENOUS
  Administered 2020-09-14: 25 ug via INTRAVENOUS

## 2020-09-14 MED ORDER — ONDANSETRON HCL 4 MG/2ML IJ SOLN
INTRAMUSCULAR | Status: AC
  Filled 2020-09-14: qty 2

## 2020-09-14 MED ORDER — BUPIVACAINE-EPINEPHRINE (PF) 0.25% -1:200000 IJ SOLN
INTRAMUSCULAR | Status: DC | PRN
  Administered 2020-09-14: 20 mL via PERINEURAL

## 2020-09-14 MED ORDER — CELECOXIB 200 MG PO CAPS
ORAL_CAPSULE | ORAL | Status: AC
  Filled 2020-09-14: qty 1

## 2020-09-14 SURGICAL SUPPLY — 42 items
ADH SKN CLS APL DERMABOND .7 (GAUZE/BANDAGES/DRESSINGS) ×1
APL PRP STRL LF DISP 70% ISPRP (MISCELLANEOUS) ×1
APPLIER CLIP 9.375 MED OPEN (MISCELLANEOUS)
APR CLP MED 9.3 20 MLT OPN (MISCELLANEOUS)
BLADE SURG 15 STRL LF DISP TIS (BLADE) ×1 IMPLANT
BLADE SURG 15 STRL SS (BLADE) ×2
CANISTER SUC SOCK COL 7IN (MISCELLANEOUS) ×2 IMPLANT
CANISTER SUCT 1200ML W/VALVE (MISCELLANEOUS) ×2 IMPLANT
CHLORAPREP W/TINT 26 (MISCELLANEOUS) ×2 IMPLANT
CLIP APPLIE 9.375 MED OPEN (MISCELLANEOUS) IMPLANT
COVER BACK TABLE 60X90IN (DRAPES) ×2 IMPLANT
COVER MAYO STAND STRL (DRAPES) ×2 IMPLANT
COVER PROBE W GEL 5X96 (DRAPES) ×2 IMPLANT
COVER WAND RF STERILE (DRAPES) IMPLANT
DECANTER SPIKE VIAL GLASS SM (MISCELLANEOUS) IMPLANT
DERMABOND ADVANCED (GAUZE/BANDAGES/DRESSINGS) ×1
DERMABOND ADVANCED .7 DNX12 (GAUZE/BANDAGES/DRESSINGS) ×1 IMPLANT
DRAPE LAPAROSCOPIC ABDOMINAL (DRAPES) ×2 IMPLANT
DRAPE UTILITY XL STRL (DRAPES) ×2 IMPLANT
ELECT COATED BLADE 2.86 ST (ELECTRODE) ×2 IMPLANT
ELECT REM PT RETURN 9FT ADLT (ELECTROSURGICAL) ×2
ELECTRODE REM PT RTRN 9FT ADLT (ELECTROSURGICAL) ×1 IMPLANT
GLOVE SURG ENC MOIS LTX SZ7.5 (GLOVE) ×4 IMPLANT
GOWN STRL REUS W/ TWL LRG LVL3 (GOWN DISPOSABLE) ×2 IMPLANT
GOWN STRL REUS W/TWL LRG LVL3 (GOWN DISPOSABLE) ×4
ILLUMINATOR WAVEGUIDE N/F (MISCELLANEOUS) IMPLANT
KIT MARKER MARGIN INK (KITS) ×2 IMPLANT
LIGHT WAVEGUIDE WIDE FLAT (MISCELLANEOUS) IMPLANT
NEEDLE HYPO 25X1 1.5 SAFETY (NEEDLE) IMPLANT
NS IRRIG 1000ML POUR BTL (IV SOLUTION) IMPLANT
PACK BASIN DAY SURGERY FS (CUSTOM PROCEDURE TRAY) ×2 IMPLANT
PENCIL SMOKE EVACUATOR (MISCELLANEOUS) ×2 IMPLANT
SLEEVE SCD COMPRESS KNEE MED (MISCELLANEOUS) ×2 IMPLANT
SPONGE LAP 18X18 RF (DISPOSABLE) ×2 IMPLANT
SUT MON AB 4-0 PC3 18 (SUTURE) ×2 IMPLANT
SUT SILK 2 0 SH (SUTURE) IMPLANT
SUT VICRYL 3-0 CR8 SH (SUTURE) ×2 IMPLANT
SYR CONTROL 10ML LL (SYRINGE) IMPLANT
TOWEL GREEN STERILE FF (TOWEL DISPOSABLE) ×2 IMPLANT
TRAY FAXITRON CT DISP (TRAY / TRAY PROCEDURE) ×2 IMPLANT
TUBE CONNECTING 20X1/4 (TUBING) ×2 IMPLANT
YANKAUER SUCT BULB TIP NO VENT (SUCTIONS) IMPLANT

## 2020-09-14 NOTE — Op Note (Signed)
09/14/2020  9:35 AM  PATIENT:  Leslie Mccormick  50 y.o. female  PRE-OPERATIVE DIAGNOSIS:  RIGHT BREAST MASS  POST-OPERATIVE DIAGNOSIS:  RIGHT BREAST MASS  PROCEDURE:  Procedure(s): RIGHT BREAST LUMPECTOMY WITH RADIOACTIVE SEED LOCALIZATION (Right)  SURGEON:  Surgeon(s) and Role:    Jovita Kussmaul, MD - Primary  PHYSICIAN ASSISTANT:   ASSISTANTS: none   ANESTHESIA:   local and general  EBL:  minimal   BLOOD ADMINISTERED:none  DRAINS: none   LOCAL MEDICATIONS USED:  MARCAINE     SPECIMEN:  Source of Specimen:  right breast tissue  DISPOSITION OF SPECIMEN:  PATHOLOGY  COUNTS:  YES  TOURNIQUET:  * No tourniquets in log *  DICTATION: .Dragon Dictation   After informed consent was obtained the patient was brought to the operating room and placed in the supine position on the operating table.  After adequate induction of general anesthesia the patient's right breast was prepped with ChloraPrep, allowed to dry, and draped in usual sterile manner.  An appropriate timeout was performed.  Previously an I-125 seed was placed in the upper portion of the right breast to mark an area of growing mass.  The neoprobe was set to I-125 in the area of radioactivity was readily identified.  Because of the superficial nature of the mass and seed I elected to make an elliptical incision in the skin overlying the area of radioactivity with a 15 blade knife.  The incision was carried through the skin and subcutaneous tissue sharply with the electrocautery.  Dissection was then carried around the radioactive seed while checking the area of radioactivity frequently with the neoprobe.  Once the mass was completely removed it was oriented with the appropriate paint colors.  A specimen radiograph was obtained that showed the clip and seed to be near the center of the specimen.  The specimen was then sent to pathology for further evaluation.  Hemostasis was achieved using the Bovie electrocautery.  The  wound was irrigated with saline and infiltrated with more quarter percent Marcaine.  The deep layer of the wound was then closed with layers of interrupted 3-0 Vicryl stitches.  The skin was then closed with a running 4-0 Monocryl subcuticular stitch.  Dermabond dressings were applied.  The patient tolerated the procedure well.  At the end of the case all needle sponge and instrument counts were correct.  The patient was then awakened and taken to recovery in stable condition.  PLAN OF CARE: Discharge to home after PACU  PATIENT DISPOSITION:  PACU - hemodynamically stable.   Delay start of Pharmacological VTE agent (>24hrs) due to surgical blood loss or risk of bleeding: not applicable

## 2020-09-14 NOTE — Discharge Instructions (Signed)
Next dose of Tylenol can be given after 1:30PM. Next dose of NSAID (Ibuprofen, Motrin, Aleve) can be given after 1:30PM.     Post Anesthesia Home Care Instructions  Activity: Get plenty of rest for the remainder of the day. A responsible individual must stay with you for 24 hours following the procedure.  For the next 24 hours, DO NOT: -Drive a car -Paediatric nurse -Drink alcoholic beverages -Take any medication unless instructed by your physician -Make any legal decisions or sign important papers.  Meals: Start with liquid foods such as gelatin or soup. Progress to regular foods as tolerated. Avoid greasy, spicy, heavy foods. If nausea and/or vomiting occur, drink only clear liquids until the nausea and/or vomiting subsides. Call your physician if vomiting continues.  Special Instructions/Symptoms: Your throat may feel dry or sore from the anesthesia or the breathing tube placed in your throat during surgery. If this causes discomfort, gargle with warm salt water. The discomfort should disappear within 24 hours.  If you had a scopolamine patch placed behind your ear for the management of post- operative nausea and/or vomiting:  1. The medication in the patch is effective for 72 hours, after which it should be removed.  Wrap patch in a tissue and discard in the trash. Wash hands thoroughly with soap and water. 2. You may remove the patch earlier than 72 hours if you experience unpleasant side effects which may include dry mouth, dizziness or visual disturbances. 3. Avoid touching the patch. Wash your hands with soap and water after contact with the patch.

## 2020-09-14 NOTE — Transfer of Care (Signed)
Immediate Anesthesia Transfer of Care Note  Patient: Leslie Mccormick  Procedure(s) Performed: RIGHT BREAST LUMPECTOMY WITH RADIOACTIVE SEED LOCALIZATION (Right Breast)  Patient Location: PACU  Anesthesia Type:General  Level of Consciousness: drowsy  Airway & Oxygen Therapy: Patient Spontanous Breathing and Patient connected to face mask oxygen  Post-op Assessment: Report given to RN and Post -op Vital signs reviewed and stable  Post vital signs: Reviewed and stable  Last Vitals:  Vitals Value Taken Time  BP 105/62 09/14/20 0942  Temp 36.4 C 09/14/20 0942  Pulse 94 09/14/20 0944  Resp 16 09/14/20 0944  SpO2 100 % 09/14/20 0944  Vitals shown include unvalidated device data.  Last Pain:  Vitals:   09/14/20 0733  TempSrc: Oral  PainSc: 0-No pain         Complications: No complications documented.

## 2020-09-14 NOTE — Interval H&P Note (Signed)
History and Physical Interval Note:  09/14/2020 8:08 AM  Leslie Mccormick  has presented today for surgery, with the diagnosis of RIGHT BREAST MASS.  The various methods of treatment have been discussed with the patient and family. After consideration of risks, benefits and other options for treatment, the patient has consented to  Procedure(s): RIGHT BREAST LUMPECTOMY WITH RADIOACTIVE SEED LOCALIZATION (Right) as a surgical intervention.  The patient's history has been reviewed, patient examined, no change in status, stable for surgery.  I have reviewed the patient's chart and labs.  Questions were answered to the patient's satisfaction.     Autumn Messing III

## 2020-09-14 NOTE — Anesthesia Postprocedure Evaluation (Signed)
Anesthesia Post Note  Patient: Leslie Mccormick  Procedure(s) Performed: RIGHT BREAST LUMPECTOMY WITH RADIOACTIVE SEED LOCALIZATION (Right Breast)     Patient location during evaluation: PACU Anesthesia Type: General Level of consciousness: awake and alert Pain management: pain level controlled Vital Signs Assessment: post-procedure vital signs reviewed and stable Respiratory status: spontaneous breathing, nonlabored ventilation, respiratory function stable and patient connected to nasal cannula oxygen Cardiovascular status: blood pressure returned to baseline and stable Postop Assessment: no apparent nausea or vomiting Anesthetic complications: no   No complications documented.  Last Vitals:  Vitals:   09/14/20 1000 09/14/20 1017  BP: 112/62 128/73  Pulse: (!) 109 94  Resp: 20 19  Temp:  36.8 C  SpO2: 98% 98%    Last Pain:  Vitals:   09/14/20 1044  TempSrc:   PainSc: 3                  Josefine Fuhr S

## 2020-09-14 NOTE — Anesthesia Procedure Notes (Signed)
Procedure Name: LMA Insertion Date/Time: 09/14/2020 8:51 AM Performed by: Ezequiel Kayser, CRNA Pre-anesthesia Checklist: Patient identified, Emergency Drugs available, Suction available and Patient being monitored Patient Re-evaluated:Patient Re-evaluated prior to induction Oxygen Delivery Method: Circle System Utilized Preoxygenation: Pre-oxygenation with 100% oxygen Induction Type: IV induction Ventilation: Mask ventilation without difficulty LMA: LMA inserted LMA Size: 4.0 Number of attempts: 1 Airway Equipment and Method: Bite block Placement Confirmation: positive ETCO2 Tube secured with: Tape Dental Injury: Teeth and Oropharynx as per pre-operative assessment  Comments: Eyes taped prior to insertion

## 2020-09-14 NOTE — H&P (Signed)
Leslie Mccormick  Location: [facility] Patient #: [id number] DOB: 05-24-1971 Married / Language: English / Race: Black or African American Female   History of Present Illness  The patient is a 50 year old female who presents with a breast mass. We are asked to see the patient in consultation by Dr. Dellis Mccormick to evaluate her for a right breast mass. The patient is a 50 year old black female who initially felt a mass in the upper portion of the right breast 2 years ago. At that time it measured 1.4 cm. It was biopsied and came back as fibrocystic tissue which apparently was concordant at that time. Over the last few months she has felt a more significant mass in the same location. On mammogram and ultrasound the mass now measures 2.7 cm. Because of the size increase the radiologist's recommendation is to have the mass removed. They did not feel like repeat biopsy was necessary.   Past Surgical History  Oral Surgery   Diagnostic Studies History Colonoscopy  never Mammogram  1-3 years ago  Allergies  Tylox *ANALGESICS - OPIOID*  Allergies Reconciled   Medication History NuvaRing (0.12-0.015MG /24HR Ring, Vaginal) Active. Vitamin D (50 MCG(2000 UT) Capsule, 1 (one) Oral) Active. Multiple Vitamins/Womens (1 (one) Oral) Active. Caltrate 600+D (600-800MG -UNIT Tablet, Oral) Active. Krill Oil (350MG  Capsule, Oral) Active. Medications Reconciled  Social History  Caffeine use  Tea. No alcohol use  No drug use  Tobacco use  Never smoker.  Family History Breast Cancer  Sister. Diabetes Mellitus  Father. Hypertension  Father, Mother.  Pregnancy / Birth History  Age at menarche  33 years. Age of menopause  <45 Gravida  0 Irregular periods  Para  0  Other Problems  Oophorectomy  Right. Ovarian Cancer     Review of Systems  General Present- Night Sweats. Not Present- Appetite Loss, Chills, Fatigue, Fever, Weight Gain and Weight Loss. Skin  Not Present- Change in Wart/Mole, Dryness, Hives, Jaundice, New Lesions, Non-Healing Wounds, Rash and Ulcer. HEENT Present- Wears glasses/contact lenses. Not Present- Earache, Hearing Loss, Hoarseness, Nose Bleed, Oral Ulcers, Ringing in the Ears, Seasonal Allergies, Sinus Pain, Sore Throat, Visual Disturbances and Yellow Eyes. Respiratory Not Present- Bloody sputum, Chronic Cough, Difficulty Breathing, Snoring and Wheezing. Breast Not Present- Breast Mass, Breast Pain, Nipple Discharge and Skin Changes. Cardiovascular Not Present- Chest Pain, Difficulty Breathing Lying Down, Leg Cramps, Palpitations, Rapid Heart Rate, Shortness of Breath and Swelling of Extremities. Gastrointestinal Not Present- Abdominal Pain, Bloating, Bloody Stool, Change in Bowel Habits, Chronic diarrhea, Constipation, Difficulty Swallowing, Excessive gas, Gets full quickly at meals, Hemorrhoids, Indigestion, Nausea, Rectal Pain and Vomiting. Female Genitourinary Not Present- Frequency, Nocturia, Painful Urination, Pelvic Pain and Urgency. Musculoskeletal Not Present- Back Pain, Joint Pain, Joint Stiffness, Muscle Pain, Muscle Weakness and Swelling of Extremities. Neurological Not Present- Decreased Memory, Fainting, Headaches, Numbness, Seizures, Tingling, Tremor, Trouble walking and Weakness. Psychiatric Not Present- Anxiety, Bipolar, Change in Sleep Pattern, Depression, Fearful and Frequent crying. Endocrine Not Present- Cold Intolerance, Excessive Hunger, Hair Changes, Heat Intolerance, Hot flashes and New Diabetes. Hematology Not Present- Blood Thinners, Easy Bruising, Excessive bleeding, Gland problems, HIV and Persistent Infections.  Vitals  Weight: 176 lb Height: 62in Body Surface Area: 1.81 m Body Mass Index: 32.19 kg/m  Temp.: 97.30F  Pulse: 72 (Regular)  BP: 130/78(Sitting, Left Arm, Standard)       Physical Exam  General Mental Status-Alert. General Appearance-Consistent with stated  age. Hydration-Well hydrated. Voice-Normal.  Head and Neck Head-normocephalic, atraumatic with no lesions or  palpable masses. Trachea-midline. Thyroid Gland Characteristics - normal size and consistency.  Eye Eyeball - Bilateral-Extraocular movements intact. Sclera/Conjunctiva - Bilateral-No scleral icterus.  Chest and Lung Exam Chest and lung exam reveals -quiet, even and easy respiratory effort with no use of accessory muscles and on auscultation, normal breath sounds, no adventitious sounds and normal vocal resonance. Inspection Chest Wall - Normal. Back - normal.  Breast Note: There is a rectangular shaped area of dense flat tissue in the upper portion of the right breast measuring about 3 cm. There is a similar area of density in the upper portion of the left breast. There is no palpable axillary, supraclavicular, or cervical lymphadenopathy.   Cardiovascular Cardiovascular examination reveals -normal heart sounds, regular rate and rhythm with no murmurs and normal pedal pulses bilaterally.  Abdomen Inspection Inspection of the abdomen reveals - No Hernias. Skin - Scar - no surgical scars. Palpation/Percussion Palpation and Percussion of the abdomen reveal - Soft, Non Tender, No Rebound tenderness, No Rigidity (guarding) and No hepatosplenomegaly. Auscultation Auscultation of the abdomen reveals - Bowel sounds normal.  Neurologic Neurologic evaluation reveals -alert and oriented x 3 with no impairment of recent or remote memory. Mental Status-Normal.  Musculoskeletal Normal Exam - Left-Upper Extremity Strength Normal and Lower Extremity Strength Normal. Normal Exam - Right-Upper Extremity Strength Normal and Lower Extremity Strength Normal.  Lymphatic Head & Neck  General Head & Neck Lymphatics: Bilateral - Description - Normal. Axillary  General Axillary Region: Bilateral - Description - Normal. Tenderness - Non Tender. Femoral &  Inguinal  Generalized Femoral & Inguinal Lymphatics: Bilateral - Description - Normal. Tenderness - Non Tender.    Assessment & Plan  BREAST MASS, RIGHT (N63.10) Impression: The patient appears to have a 2.7 cm mass in the upper portion of the right breast that has grown since it was first seen about 2 years ago. It was biopsied 2 years ago and came back benign. The radiologists feel like because of the appearance and the increase in size that it should be removed and I would favor this as well. I have discussed with her in detail the risks and benefits of the operation as well as some of the technical aspects and she understands. At this point she feels as though she does not want surgery but she is willing to think about it over the next few weeks and let me know her final decision. If she decides not to have surgery than I would plan to see her back in about 6 months for repeat exam and imaging. This patient encounter took 45 minutes today to perform the following: take history, perform exam, review outside records, interpret imaging, counsel the patient on their diagnosis and document encounter, findings & plan in the EHR Current Plans Follow up with [place] in the office in 6 MONTHS.   Call us sooner as needed.  Addendum Note(Mintie Witherington S. Toth MD; 07/12/2020 10:16 AM) After further review of the mammograms it is not clear that the mass that we feel corresponds with the mass that was previously biopsied. Because of this I would recommend that we localized this with a radioactive seed prior to removing it.

## 2020-09-14 NOTE — Anesthesia Preprocedure Evaluation (Signed)
Anesthesia Evaluation  Patient identified by MRN, date of birth, ID band Patient awake    Reviewed: Allergy & Precautions, H&P , NPO status , Patient's Chart, lab work & pertinent test results  Airway Mallampati: II   Neck ROM: full    Dental   Pulmonary neg pulmonary ROS,    breath sounds clear to auscultation       Cardiovascular negative cardio ROS   Rhythm:regular Rate:Normal     Neuro/Psych    GI/Hepatic   Endo/Other    Renal/GU      Musculoskeletal   Abdominal   Peds  Hematology   Anesthesia Other Findings   Reproductive/Obstetrics H/o ovarian CA Breast mass.                             Anesthesia Physical Anesthesia Plan  ASA: II  Anesthesia Plan: General   Post-op Pain Management:    Induction: Intravenous  PONV Risk Score and Plan: 3 and Ondansetron, Dexamethasone, Midazolam and Treatment may vary due to age or medical condition  Airway Management Planned: LMA  Additional Equipment:   Intra-op Plan:   Post-operative Plan: Extubation in OR  Informed Consent: I have reviewed the patients History and Physical, chart, labs and discussed the procedure including the risks, benefits and alternatives for the proposed anesthesia with the patient or authorized representative who has indicated his/her understanding and acceptance.     Dental advisory given  Plan Discussed with: CRNA, Anesthesiologist and Surgeon  Anesthesia Plan Comments:         Anesthesia Quick Evaluation

## 2020-09-15 ENCOUNTER — Encounter (HOSPITAL_BASED_OUTPATIENT_CLINIC_OR_DEPARTMENT_OTHER): Payer: Self-pay | Admitting: General Surgery

## 2020-09-16 LAB — SURGICAL PATHOLOGY

## 2020-09-19 ENCOUNTER — Telehealth: Payer: Self-pay | Admitting: *Deleted

## 2020-09-19 NOTE — Telephone Encounter (Signed)
Patient called to follow up from telephone encounter on 07/11/20. Patient had breast mass removed on 09/14/20, she wanted to know if she should continue using the nuvaring? She removed ring for now, before having surgery she was using ring in for 4 week, the switch to a new ring. She asked if best to continue this or go back to inserting a ring for 3 week,then have a 1 week break without ring? Patient said she wondering if the nuvaring caused this breast mass. Please advise

## 2020-09-20 ENCOUNTER — Ambulatory Visit: Payer: BC Managed Care – PPO | Admitting: Nurse Practitioner

## 2020-09-23 NOTE — Telephone Encounter (Signed)
Left message for patient to call.

## 2020-09-23 NOTE — Telephone Encounter (Signed)
Patho: Fibroadenoma.  Nuvaring didn't cause it.  Can resume Nuvaring x 4 wks, then switching ring (continuous use).

## 2020-09-23 NOTE — Telephone Encounter (Signed)
Patient informed with below note. 

## 2020-10-05 ENCOUNTER — Ambulatory Visit: Payer: BC Managed Care – PPO | Admitting: Obstetrics & Gynecology

## 2020-10-05 ENCOUNTER — Other Ambulatory Visit: Payer: Self-pay

## 2020-10-05 ENCOUNTER — Encounter: Payer: Self-pay | Admitting: Obstetrics & Gynecology

## 2020-10-05 VITALS — BP 126/80

## 2020-10-05 DIAGNOSIS — N951 Menopausal and female climacteric states: Secondary | ICD-10-CM | POA: Diagnosis not present

## 2020-10-05 DIAGNOSIS — Z30011 Encounter for initial prescription of contraceptive pills: Secondary | ICD-10-CM

## 2020-10-05 MED ORDER — NORETHINDRONE 0.35 MG PO TABS: 1.0000 | ORAL_TABLET | Freq: Every day | ORAL | 4 refills | Status: DC

## 2020-10-05 NOTE — Progress Notes (Signed)
    Leslie Mccormick 11-May-1971 [id number]        50 y.o.  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  Perimenopause.  Previously on Nuvaring, would like to be back on contraception.   OB History  Gravida Para Term Preterm AB Living  0 0 0 0 0 0  SAB IAB Ectopic Multiple Live Births  0 0 0 0 0    Past medical history,surgical history, problem list, medications, allergies, family history and social history were all reviewed and documented in the EPIC chart.   Directed ROS with pertinent positives and negatives documented in the history of present illness/assessment and plan.  Exam:  Vitals:   10/05/20 1102  BP: 126/80   General appearance:  Normal  Gyn exam deferred   Assessment/Plan:  50 y.o. G0P0000   1. Perimenopause Oligomenorrhea with intermittent hot flushes.  2. Encounter for initial prescription of contraceptive pills Recommend contraception until 2 years into menopause.  Patient is concerned about stimulation of the breast with estrogens given the recent removal of a fibroadenoma of the breast.  After counseling on different contraceptive methods, decision to start on the progestin only pill.  Usage and risks reviewed with patient.  Prescription sent to pharmacy.  Other orders - norethindrone (MICRONOR) 0.35 MG tablet; Take 1 tablet (0.35 mg total) by mouth daily.  [name] MD, 11:26 AM 10/05/2020

## 2021-04-03 ENCOUNTER — Other Ambulatory Visit: Payer: Self-pay | Admitting: Obstetrics & Gynecology

## 2021-04-03 DIAGNOSIS — Z0000 Encounter for general adult medical examination without abnormal findings: Secondary | ICD-10-CM

## 2021-04-26 ENCOUNTER — Other Ambulatory Visit: Payer: Self-pay | Admitting: Sports Medicine

## 2021-04-26 ENCOUNTER — Ambulatory Visit
Admission: RE | Admit: 2021-04-26 | Discharge: 2021-04-26 | Disposition: A | Discharge status: Home / Self Care | Payer: BC Managed Care – PPO | Source: Ambulatory Visit | Attending: Sports Medicine | Admitting: Sports Medicine

## 2021-04-26 DIAGNOSIS — M25562 Pain in left knee: Secondary | ICD-10-CM

## 2021-04-26 DIAGNOSIS — M79644 Pain in right finger(s): Secondary | ICD-10-CM

## 2021-05-05 ENCOUNTER — Ambulatory Visit (INDEPENDENT_AMBULATORY_CARE_PROVIDER_SITE_OTHER): Payer: BC Managed Care – PPO | Admitting: Obstetrics & Gynecology

## 2021-05-05 ENCOUNTER — Encounter: Payer: Self-pay | Admitting: Obstetrics & Gynecology

## 2021-05-05 ENCOUNTER — Other Ambulatory Visit: Payer: Self-pay

## 2021-05-05 VITALS — BP 118/74 | HR 76 | Resp 16 | Ht 62.25 in | Wt 201.0 lb

## 2021-05-05 DIAGNOSIS — Z3041 Encounter for surveillance of contraceptive pills: Secondary | ICD-10-CM

## 2021-05-05 DIAGNOSIS — E6609 Other obesity due to excess calories: Secondary | ICD-10-CM

## 2021-05-05 DIAGNOSIS — Z6836 Body mass index (BMI) 36.0-36.9, adult: Secondary | ICD-10-CM

## 2021-05-05 DIAGNOSIS — Z01419 Encounter for gynecological examination (general) (routine) without abnormal findings: Secondary | ICD-10-CM | POA: Diagnosis not present

## 2021-05-05 MED ORDER — NORETHINDRONE 0.35 MG PO TABS: 1.0000 | ORAL_TABLET | Freq: Every day | ORAL | 4 refills | Status: DC

## 2021-05-05 NOTE — Progress Notes (Signed)
Leslie Mccormick 04-13-1971 [id number]   History:    50 y.o.  G0 Married.  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   RP:  Established patient presenting for annual gyn exam    HPI: Well on the Progestin pill.  No BTB.  No pelvic pain.  Abstinent.  Urine/BMs normal.  Intolerance to Gluten.  Breasts normal. 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 BMI increased to 36.47.  Exercising regularly.  Health Labs with Fam MD. Will schedule colono.    Past medical history,surgical history, family history and social history were all reviewed and documented in the EPIC chart.  Gynecologic History No LMP recorded. (Menstrual status: Oral contraceptives).   Obstetric History OB History  Gravida Para Term Preterm AB Living  0 0 0 0 0 0  SAB IAB Ectopic Multiple Live Births  0 0 0 0 0     ROS: A ROS was performed and pertinent positives and negatives are included in the history.  GENERAL: No fevers or chills. HEENT: No change in vision, no earache, sore throat or sinus congestion. NECK: No pain or stiffness. CARDIOVASCULAR: No chest pain or pressure. No palpitations. PULMONARY: No shortness of breath, cough or wheeze. GASTROINTESTINAL: No abdominal pain, nausea, vomiting or diarrhea, melena or bright red blood per rectum. GENITOURINARY: No urinary frequency, urgency, hesitancy or dysuria. MUSCULOSKELETAL: No joint or muscle pain, no back pain, no recent trauma. DERMATOLOGIC: No rash, no itching, no lesions. ENDOCRINE: No polyuria, polydipsia, no heat or cold intolerance. No recent change in weight. HEMATOLOGICAL: No anemia or easy bruising or bleeding. NEUROLOGIC: No headache, seizures, numbness, tingling or weakness. PSYCHIATRIC: No depression, no loss of interest in normal activity or change in sleep pattern.     Exam:   BP 118/74   Pulse 76   Resp 16   Ht 5' 2.25" (1.581 m)   Wt 201 lb (91.2 kg)   BMI 36.47 kg/m   Body mass index is 36.47 kg/m.  General appearance : Well developed  well nourished female. No acute distress HEENT: Eyes: no retinal hemorrhage or exudates,  Neck supple, trachea midline, no carotid bruits, no thyroidmegaly Lungs: Clear to auscultation, no rhonchi or wheezes, or rib retractions  Heart: Regular rate and rhythm, no murmurs or gallops Breast:Examined in sitting and supine position were symmetrical in appearance, no palpable masses or tenderness,  no skin retraction, no nipple inversion, no nipple discharge, no skin discoloration, no axillary or supraclavicular lymphadenopathy Abdomen: no palpable masses or tenderness, no rebound or guarding Extremities: no edema or skin discoloration or tenderness  Pelvic: Vulva: Normal             Vagina: No gross lesions or discharge  Cervix: No gross lesions or discharge.  Pap reflex done.  Uterus  AV, normal size, shape and consistency, non-tender and mobile  Adnexa  Without masses or tenderness  Anus: Normal   Assessment/Plan:  50 y.o. female for annual exam   1. Encounter for routine gynecological examination with Papanicolaou smear of cervix prepped Normal gynecologic exam.  Pap reflex done.  Breast exam normal.  Screening mammogram scheduled.  Schedule colonoscopy.  Health labs with family physician. - Cytology - PAP( [place])  2. Encounter for surveillance of contraceptive pill Well on the progestin only birth control pills.  No contraindication to continue.  Prescription sent to pharmacy.  3. Class 2 obesity due to excess calories without serious comorbidity with body mass index (BMI) of 36.0 to 36.9 in adult Refer  to [facility].  Lower calorie/carb diet.  Aerobic activities 5 times a week and light weightlifting every 2 days.  Other orders - betamethasone, augmented, (DIPROLENE) 0.05 % lotion; Apply topically. - celecoxib (CELEBREX) 100 MG capsule; Take 100 mg by mouth 2 (two) times daily as needed. - Ciclopirox 1 % shampoo; Apply topically. - PREVIDENT 5000 BOOSTER PLUS 1.1 %  PSTE; SMARTSIG:Sparingly To Teeth - Multiple Vitamin (MULTIVITAMIN PO); Take by mouth. centrum - MAGNESIUM PO; Take by mouth. - Multiple Vitamins-Minerals (ZINC PO); Take by mouth. - Ascorbic Acid (VITAMIN C PO); Take by mouth. - norethindrone (MICRONOR) 0.35 MG tablet; Take 1 tablet (0.35 mg total) by mouth daily.   [name] MD, 4:23 PM 05/05/2021

## 2021-05-08 ENCOUNTER — Telehealth: Payer: Self-pay | Admitting: *Deleted

## 2021-05-08 DIAGNOSIS — Z6836 Body mass index (BMI) 36.0-36.9, adult: Secondary | ICD-10-CM

## 2021-05-08 DIAGNOSIS — E6609 Other obesity due to excess calories: Secondary | ICD-10-CM

## 2021-05-08 NOTE — Telephone Encounter (Signed)
Referral Placed to Weight loss center

## 2021-05-08 NOTE — Telephone Encounter (Signed)
-----   Message from [name], MD sent at 05/05/2021  4:55 PM EDT ----- Regarding: Refer to [facility] BMI 36+.  Significant gain.

## 2021-05-10 LAB — CYTOLOGY - PAP
Diagnosis: NEGATIVE
Diagnosis: REACTIVE

## 2021-05-11 NOTE — Telephone Encounter (Signed)
Appt 06/19/2021

## 2021-05-22 ENCOUNTER — Ambulatory Visit: Payer: BC Managed Care – PPO

## 2021-05-30 ENCOUNTER — Other Ambulatory Visit: Payer: Self-pay | Admitting: Sports Medicine

## 2021-05-30 ENCOUNTER — Ambulatory Visit
Admission: RE | Admit: 2021-05-30 | Discharge: 2021-05-30 | Disposition: A | Discharge status: Home / Self Care | Payer: BC Managed Care – PPO | Source: Ambulatory Visit | Attending: Sports Medicine | Admitting: Sports Medicine

## 2021-05-30 ENCOUNTER — Ambulatory Visit: Payer: BC Managed Care – PPO

## 2021-05-30 DIAGNOSIS — M545 Low back pain, unspecified: Secondary | ICD-10-CM

## 2021-06-19 ENCOUNTER — Other Ambulatory Visit: Payer: Self-pay

## 2021-06-19 ENCOUNTER — Encounter: Payer: BC Managed Care – PPO | Attending: Obstetrics & Gynecology | Admitting: Registered"

## 2021-06-19 ENCOUNTER — Encounter: Payer: Self-pay | Admitting: Registered"

## 2021-06-19 DIAGNOSIS — E669 Obesity, unspecified: Secondary | ICD-10-CM | POA: Insufficient documentation

## 2021-06-19 NOTE — Patient Instructions (Addendum)
Goals: More water, start reducing sweet tea, goal to just drink unsweet tea. Use your  container to measure water intake  Use MyPlate as a guide for balanced eating Eat better, eat sweets mindfully and only occasionally, don't keep in the house. Cut out nighttime snack unless you are hungry, then just eat a piece of fruit.  Exercise 150 min a week and a variety of exercises Look for office exercises every 1-2 hours to keep from sitting too long, can also walk the stairs on breaks.
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
## 2021-06-22 ENCOUNTER — Ambulatory Visit
Admission: RE | Admit: 2021-06-22 | Discharge: 2021-06-22 | Disposition: A | Discharge status: Home / Self Care | Payer: BC Managed Care – PPO | Source: Ambulatory Visit | Attending: Obstetrics & Gynecology | Admitting: Obstetrics & Gynecology

## 2021-06-22 ENCOUNTER — Other Ambulatory Visit: Payer: Self-pay

## 2021-06-22 DIAGNOSIS — Z0000 Encounter for general adult medical examination without abnormal findings: Secondary | ICD-10-CM

## 2021-08-14 ENCOUNTER — Ambulatory Visit: Payer: BC Managed Care – PPO | Admitting: Registered"

## 2021-11-30 ENCOUNTER — Encounter (HOSPITAL_COMMUNITY): Payer: Self-pay

## 2022-02-20 IMAGING — MG MM DIGITAL SCREENING BILAT W/ TOMO AND CAD
8 series · 8 of 24 positions shown · non-contrast
Comparison: Previous exam(s).

CLINICAL DATA: Screening.

EXAM:
DIGITAL SCREENING BILATERAL MAMMOGRAM WITH TOMOSYNTHESIS AND CAD
TECHNIQUE: Bilateral screening digital craniocaudal and mediolateral oblique
mammograms were obtained. Bilateral screening digital breast
tomosynthesis was performed. The images were evaluated with
computer-aided detection.

[R MLO synth-2D]
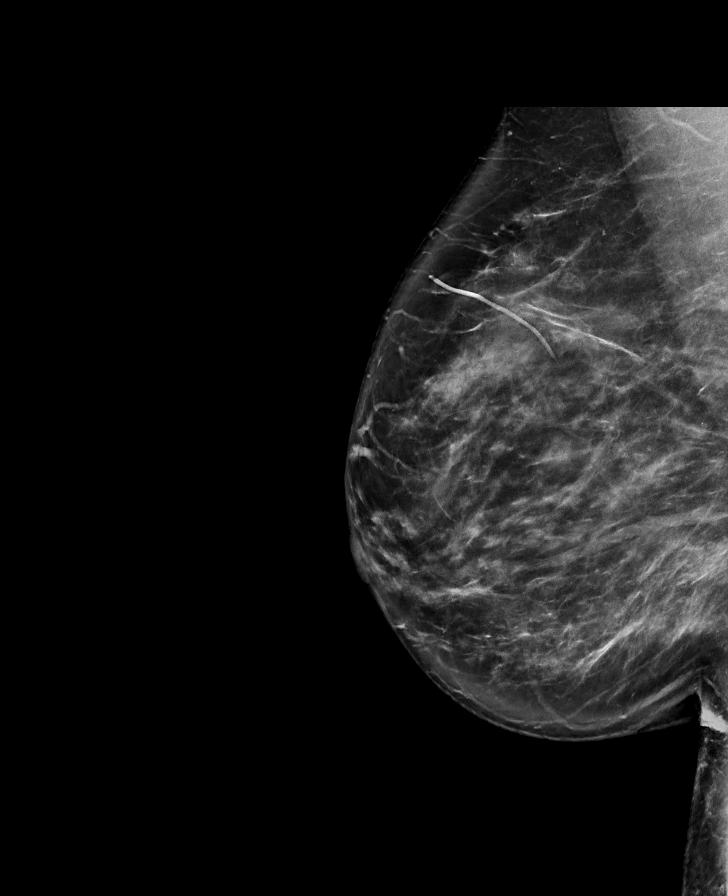

[L CC synth-2D]
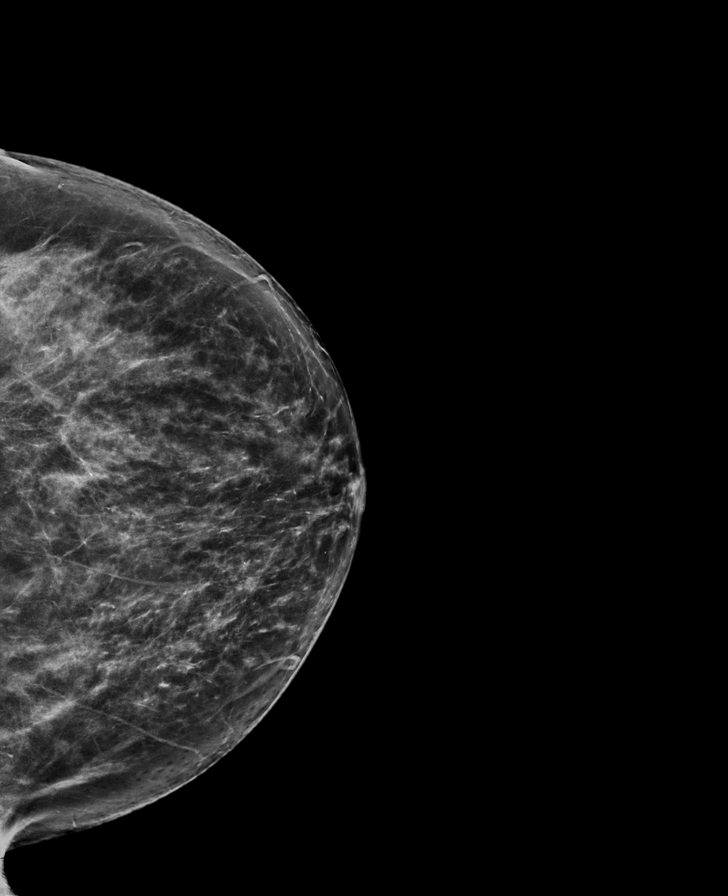

[L MLO synth-2D]
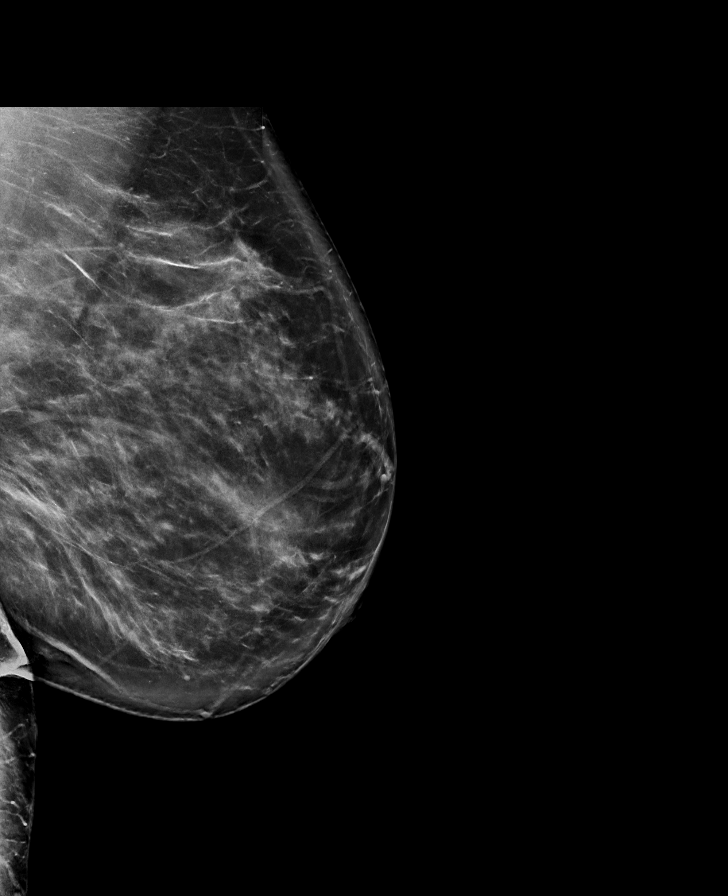

[R CC synth-2D]
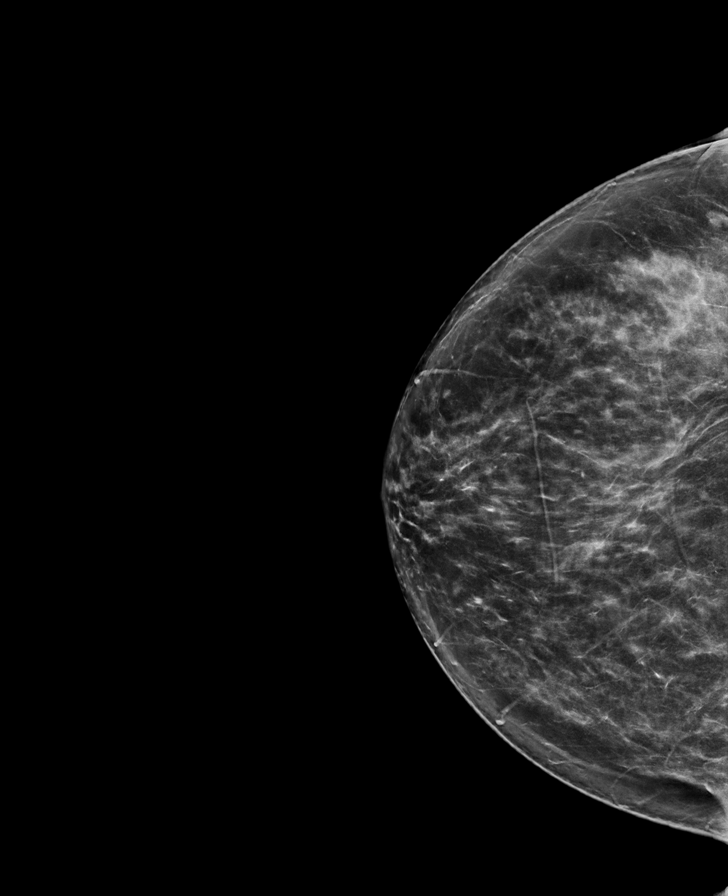

[L MLO tomo · tomo slice 48/95.0]
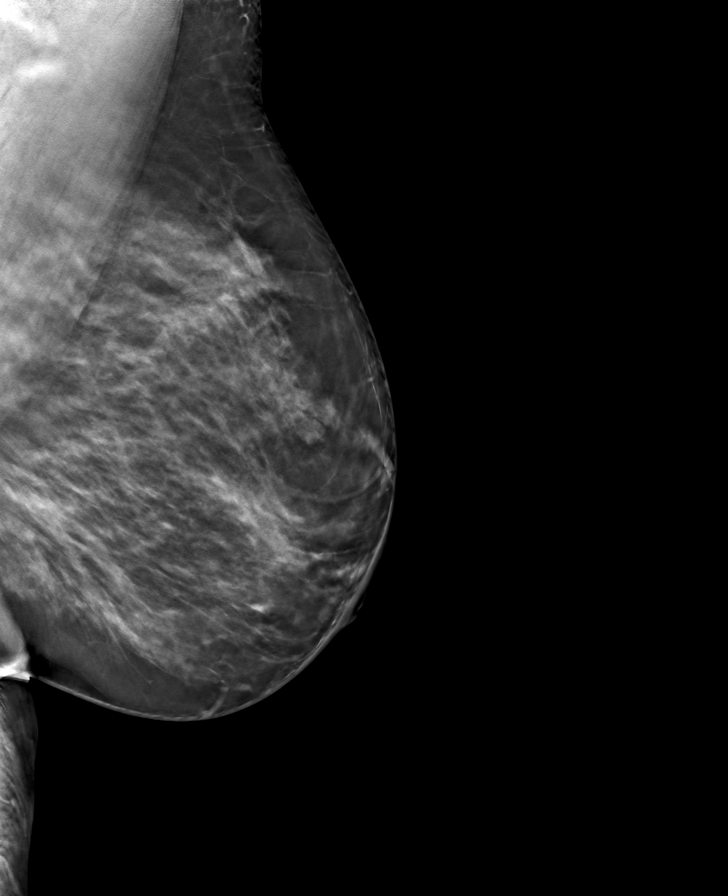

[L CC tomo · tomo slice 40/79.0]
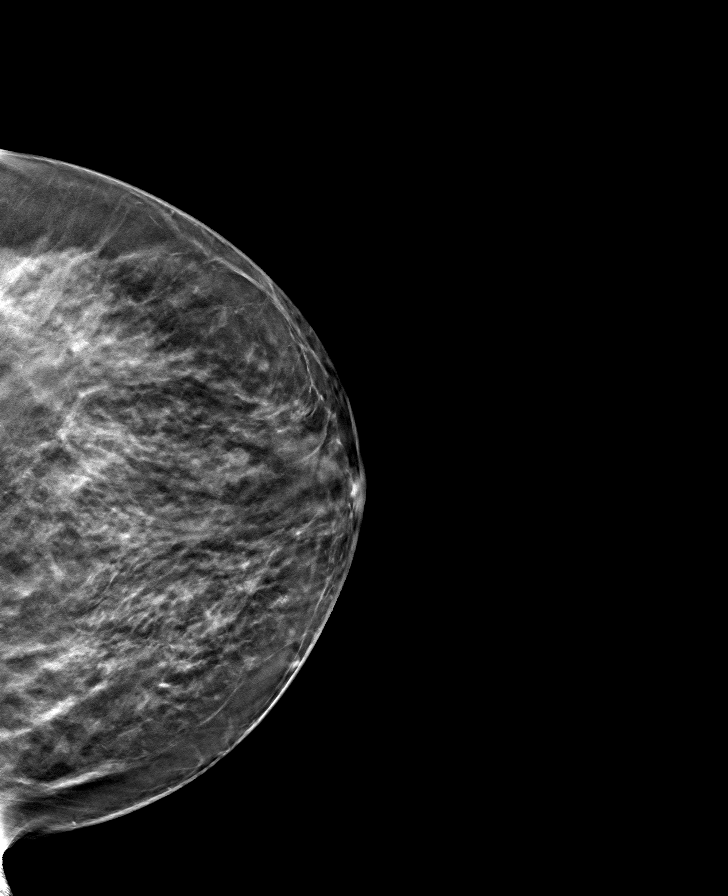

[R MLO tomo · tomo slice 47/93.0]
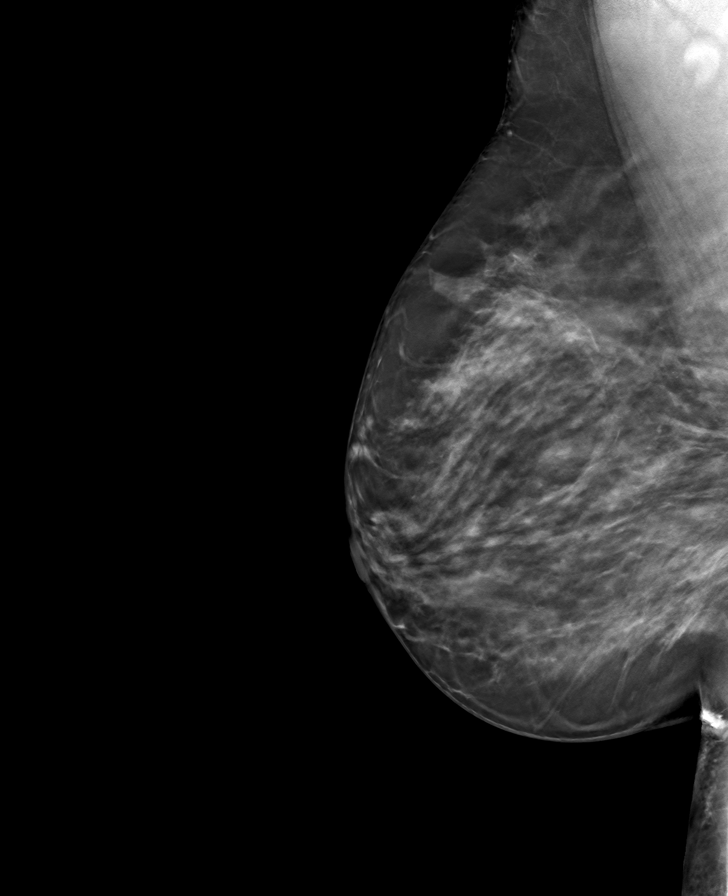

[R CC tomo · tomo slice 42/83.0]
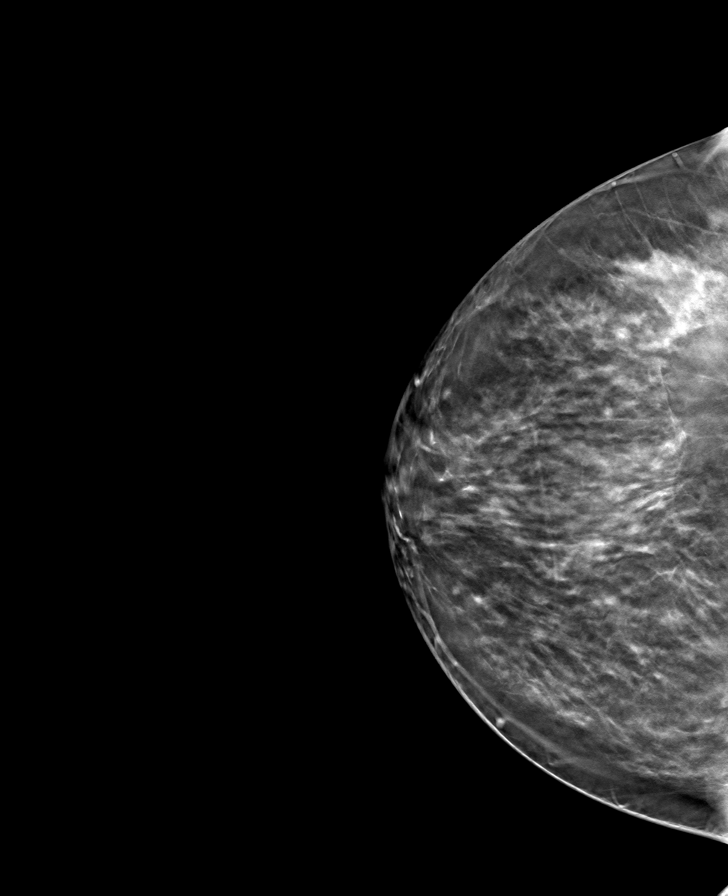

[8 of 24 positions shown; findings below may reference images not displayed]

ACR Breast Density Category c: The breast tissue is heterogeneously
dense, which may obscure small masses.
FINDINGS: There are no findings suspicious for malignancy.
IMPRESSION: No mammographic evidence of malignancy. A result letter of this
screening mammogram will be mailed directly to the patient.

RECOMMENDATION:
Screening mammogram in one year. (Code:Q3-W-BC3)

BI-RADS CATEGORY  1: Negative.

## 2022-05-08 ENCOUNTER — Other Ambulatory Visit (HOSPITAL_COMMUNITY)
Admission: RE | Admit: 2022-05-08 | Discharge: 2022-05-08 | Disposition: A | Discharge status: Home / Self Care | Payer: BC Managed Care – PPO | Source: Ambulatory Visit | Attending: Obstetrics & Gynecology | Admitting: Obstetrics & Gynecology

## 2022-05-08 ENCOUNTER — Ambulatory Visit (INDEPENDENT_AMBULATORY_CARE_PROVIDER_SITE_OTHER): Payer: BC Managed Care – PPO | Admitting: Obstetrics & Gynecology

## 2022-05-08 ENCOUNTER — Encounter: Payer: Self-pay | Admitting: Obstetrics & Gynecology

## 2022-05-08 VITALS — BP 114/80 | HR 103 | Ht 61.75 in | Wt 204.0 lb

## 2022-05-08 DIAGNOSIS — E6609 Other obesity due to excess calories: Secondary | ICD-10-CM

## 2022-05-08 DIAGNOSIS — Z01419 Encounter for gynecological examination (general) (routine) without abnormal findings: Secondary | ICD-10-CM | POA: Insufficient documentation

## 2022-05-08 DIAGNOSIS — N951 Menopausal and female climacteric states: Secondary | ICD-10-CM

## 2022-05-08 DIAGNOSIS — Z3041 Encounter for surveillance of contraceptive pills: Secondary | ICD-10-CM

## 2022-05-08 MED ORDER — NORETHINDRONE 0.35 MG PO TABS: 1.0000 | ORAL_TABLET | Freq: Every day | ORAL | 4 refills | Status: DC

## 2022-05-08 NOTE — Progress Notes (Signed)
Leslie Mccormick 1970/10/17 [id number]   History:    51 y.o. G0 Married.  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   RP:  Established patient presenting for annual gyn exam    HPI: Well on the Progestin pill.  No BTB.  No pelvic pain.  Abstinent. Pap Neg 04/2021.  Pap reflex today. Urine/BMs normal.  Intolerance to Gluten.  Breasts normal. 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 Mammo Neg 06/2021. BMI increased to 37.61.  Exercising regularly, elliptical, yoga. Health Labs with Fam MD. Will schedule colono.   Past medical history,surgical history, family history and social history were all reviewed and documented in the EPIC chart.  Gynecologic History No LMP recorded. (Menstrual status: Oral contraceptives).  Obstetric History OB History  Gravida Para Term Preterm AB Living  0 0 0 0 0 0  SAB IAB Ectopic Multiple Live Births  0 0 0 0 0     ROS: A ROS was performed and pertinent positives and negatives are included in the history.  GENERAL: No fevers or chills. HEENT: No change in vision, no earache, sore throat or sinus congestion. NECK: No pain or stiffness. CARDIOVASCULAR: No chest pain or pressure. No palpitations. PULMONARY: No shortness of breath, cough or wheeze. GASTROINTESTINAL: No abdominal pain, nausea, vomiting or diarrhea, melena or bright red blood per rectum. GENITOURINARY: No urinary frequency, urgency, hesitancy or dysuria. MUSCULOSKELETAL: No joint or muscle pain, no back pain, no recent trauma. DERMATOLOGIC: No rash, no itching, no lesions. ENDOCRINE: No polyuria, polydipsia, no heat or cold intolerance. No recent change in weight. HEMATOLOGICAL: No anemia or easy bruising or bleeding. NEUROLOGIC: No headache, seizures, numbness, tingling or weakness. PSYCHIATRIC: No depression, no loss of interest in normal activity or change in sleep pattern.     Exam:   BP 114/80   Pulse (!) 103   Ht 5' 1.75" (1.568 m)   Wt 204 lb (92.5 kg)   SpO2 96%   BMI 37.61 kg/m    Body mass index is 37.61 kg/m.  General appearance : Well developed well nourished female. No acute distress HEENT: Eyes: no retinal hemorrhage or exudates,  Neck supple, trachea midline, no carotid bruits, no thyroidmegaly Lungs: Clear to auscultation, no rhonchi or wheezes, or rib retractions  Heart: Regular rate and rhythm, no murmurs or gallops Breast:Examined in sitting and supine position were symmetrical in appearance, no palpable masses or tenderness,  no skin retraction, no nipple inversion, no nipple discharge, no skin discoloration, no axillary or supraclavicular lymphadenopathy Abdomen: no palpable masses or tenderness, no rebound or guarding Extremities: no edema or skin discoloration or tenderness  Pelvic: Vulva: Normal             Vagina: No gross lesions or discharge  Cervix: No gross lesions or discharge.  Pap reflex today.  Uterus  AV, normal size, shape and consistency, non-tender and mobile  Adnexa  Without masses or tenderness  Anus: Normal   Assessment/Plan:  51 y.o. female for annual exam   1. Encounter for routine gynecological examination with Papanicolaou smear of cervix Well on the Progestin pill.  No BTB.  No pelvic pain.  Abstinent. Pap Neg 04/2021.  Pap reflex today. Urine/BMs normal.  Intolerance to Gluten.  Breasts normal. 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 Mammo Neg 06/2021. BMI increased to 37.61.  Exercising regularly, elliptical, yoga. Health Labs with Fam MD. Will schedule colono. - Cytology - PAP( [place])  2. Encounter for surveillance of contraceptive pills Well on the Progestin pill.  No BTB.  No pelvic pain.  Abstinent. No CI to continue, prescription sent to pharmacy.  3. Perimenopause On the Progestin pill.  4. Class 2 obesity due to excess calories without serious comorbidity with body mass index (BMI) of 37.0 to 37.9 in adult Lower calorie/carb diet.  Increase fitness activities.  Other orders - fluticasone (FLONASE) 50 MCG/ACT  nasal spray; SMARTSIG:1-2 Spray(s) Both Nares Daily - loratadine (CLARITIN) 10 MG tablet; Take 10 mg by mouth daily. - norethindrone (MICRONOR) 0.35 MG tablet; Take 1 tablet (0.35 mg total) by mouth daily.   [name] MD, 4:10 PM 05/08/2022

## 2022-05-11 LAB — CYTOLOGY - PAP
Diagnosis: NEGATIVE
Diagnosis: REACTIVE

## 2022-05-14 ENCOUNTER — Other Ambulatory Visit: Payer: Self-pay | Admitting: Obstetrics & Gynecology

## 2022-05-14 DIAGNOSIS — Z1231 Encounter for screening mammogram for malignant neoplasm of breast: Secondary | ICD-10-CM

## 2022-07-04 ENCOUNTER — Ambulatory Visit: Payer: BC Managed Care – PPO

## 2022-07-06 ENCOUNTER — Ambulatory Visit
Admission: RE | Admit: 2022-07-06 | Discharge: 2022-07-06 | Disposition: A | Discharge status: Home / Self Care | Payer: BC Managed Care – PPO | Source: Ambulatory Visit | Attending: Obstetrics & Gynecology | Admitting: Obstetrics & Gynecology

## 2022-07-06 DIAGNOSIS — Z1231 Encounter for screening mammogram for malignant neoplasm of breast: Secondary | ICD-10-CM

## 2022-07-10 ENCOUNTER — Other Ambulatory Visit (HOSPITAL_COMMUNITY): Payer: Self-pay

## 2022-07-10 MED ORDER — WEGOVY 1 MG/0.5ML ~~LOC~~ SOAJ
SUBCUTANEOUS | 0 refills | Status: DC
  Filled 2022-07-10: qty 2, 30d supply, fill #0

## 2022-07-10 MED ORDER — WEGOVY 0.25 MG/0.5ML ~~LOC~~ SOAJ
SUBCUTANEOUS | 0 refills | Status: DC
  Filled 2022-07-10: qty 2, 30d supply, fill #0
  Filled 2022-08-06: qty 2, 28d supply, fill #0

## 2022-07-10 MED ORDER — WEGOVY 0.5 MG/0.5ML ~~LOC~~ SOAJ
SUBCUTANEOUS | 0 refills | Status: AC
  Filled 2022-07-10: qty 2, 30d supply, fill #0

## 2022-07-19 ENCOUNTER — Other Ambulatory Visit (HOSPITAL_COMMUNITY): Payer: Self-pay

## 2022-08-06 ENCOUNTER — Other Ambulatory Visit (HOSPITAL_COMMUNITY): Payer: Self-pay

## 2022-08-08 ENCOUNTER — Other Ambulatory Visit: Payer: Self-pay

## 2022-08-28 ENCOUNTER — Other Ambulatory Visit (HOSPITAL_COMMUNITY): Payer: Self-pay

## 2022-11-13 ENCOUNTER — Other Ambulatory Visit: Payer: Self-pay | Admitting: Sports Medicine

## 2022-11-13 DIAGNOSIS — M25562 Pain in left knee: Secondary | ICD-10-CM

## 2022-11-27 ENCOUNTER — Encounter: Payer: Self-pay | Admitting: Sports Medicine

## 2022-11-28 ENCOUNTER — Ambulatory Visit
Admission: RE | Admit: 2022-11-28 | Discharge: 2022-11-28 | Disposition: A | Discharge status: Home / Self Care | Payer: BC Managed Care – PPO | Source: Ambulatory Visit | Attending: Sports Medicine | Admitting: Sports Medicine

## 2022-11-28 DIAGNOSIS — M25562 Pain in left knee: Secondary | ICD-10-CM

## 2023-04-28 HISTORY — PX: KNEE SURGERY: SHX244

## 2023-05-10 ENCOUNTER — Ambulatory Visit: Payer: BC Managed Care – PPO | Admitting: Obstetrics & Gynecology

## 2023-05-14 ENCOUNTER — Ambulatory Visit: Payer: BC Managed Care – PPO | Admitting: Obstetrics and Gynecology

## 2023-05-14 ENCOUNTER — Encounter (HOSPITAL_COMMUNITY): Payer: Self-pay | Admitting: Emergency Medicine

## 2023-05-14 ENCOUNTER — Other Ambulatory Visit: Payer: Self-pay

## 2023-05-14 ENCOUNTER — Emergency Department (HOSPITAL_COMMUNITY): Payer: BC Managed Care – PPO

## 2023-05-14 ENCOUNTER — Emergency Department (HOSPITAL_COMMUNITY)
Admission: EM | Admit: 2023-05-14 | Discharge: 2023-05-14 | Disposition: A | Discharge status: Home / Self Care | Payer: BC Managed Care – PPO | Attending: Emergency Medicine | Admitting: Emergency Medicine

## 2023-05-14 DIAGNOSIS — K5900 Constipation, unspecified: Secondary | ICD-10-CM | POA: Diagnosis present

## 2023-05-14 DIAGNOSIS — Z96651 Presence of right artificial knee joint: Secondary | ICD-10-CM | POA: Insufficient documentation

## 2023-05-14 DIAGNOSIS — K5641 Fecal impaction: Secondary | ICD-10-CM | POA: Insufficient documentation

## 2023-05-14 LAB — CBC WITH DIFFERENTIAL/PLATELET
Abs Immature Granulocytes: 0.05 10*3/uL (ref 0.00–0.07)
Basophils Absolute: 0 10*3/uL (ref 0.0–0.1)
Basophils Relative: 0 %
Eosinophils Absolute: 0.1 10*3/uL (ref 0.0–0.5)
Eosinophils Relative: 1 %
HCT: 42.2 % (ref 36.0–46.0)
Hemoglobin: 14.3 g/dL (ref 12.0–15.0)
Immature Granulocytes: 0 %
Lymphocytes Relative: 13 %
Lymphs Abs: 1.9 10*3/uL (ref 0.7–4.0)
MCH: 30 pg (ref 26.0–34.0)
MCHC: 33.9 g/dL (ref 30.0–36.0)
MCV: 88.5 fL (ref 80.0–100.0)
Monocytes Absolute: 1 10*3/uL (ref 0.1–1.0)
Monocytes Relative: 7 %
Neutro Abs: 11.3 10*3/uL — ABNORMAL HIGH (ref 1.7–7.7)
Neutrophils Relative %: 79 %
Platelets: 332 10*3/uL (ref 150–400)
RBC: 4.77 MIL/uL (ref 3.87–5.11)
RDW: 12.1 % (ref 11.5–15.5)
WBC: 14.3 10*3/uL — ABNORMAL HIGH (ref 4.0–10.5)
nRBC: 0 % (ref 0.0–0.2)

## 2023-05-14 LAB — COMPREHENSIVE METABOLIC PANEL
ALT: 20 U/L (ref 0–44)
AST: 21 U/L (ref 15–41)
Albumin: 4.3 g/dL (ref 3.5–5.0)
Alkaline Phosphatase: 54 U/L (ref 38–126)
Anion gap: 12 (ref 5–15)
BUN: 10 mg/dL (ref 6–20)
CO2: 23 mmol/L (ref 22–32)
Calcium: 8.9 mg/dL (ref 8.9–10.3)
Chloride: 102 mmol/L (ref 98–111)
Creatinine, Ser: 0.9 mg/dL (ref 0.44–1.00)
GFR, Estimated: >60 mL/min (ref >60)
Glucose, Bld: 111 mg/dL — ABNORMAL HIGH (ref 70–99)
Potassium: 3.7 mmol/L (ref 3.5–5.1)
Sodium: 137 mmol/L (ref 135–145)
Total Bilirubin: 0.6 mg/dL (ref 0.3–1.2)
Total Protein: 7.5 g/dL (ref 6.5–8.1)

## 2023-05-14 LAB — LIPASE, BLOOD: Lipase: 24 U/L (ref 11–51)

## 2023-05-14 LAB — HCG, SERUM, QUALITATIVE: Preg, Serum: NEGATIVE

## 2023-05-14 MED ORDER — FLEET ENEMA RE ENEM
1.0000 | ENEMA | Freq: Once | RECTAL | Status: AC
  Administered 2023-05-14: 1 via RECTAL
  Filled 2023-05-14: qty 1

## 2023-05-14 MED ORDER — MORPHINE SULFATE (PF) 4 MG/ML IV SOLN
4.0000 mg | Freq: Once | INTRAVENOUS | Status: AC
  Administered 2023-05-14: 4 mg via INTRAVENOUS
  Filled 2023-05-14: qty 1

## 2023-05-14 MED ORDER — LIDOCAINE HCL URETHRAL/MUCOSAL 2 % EX GEL
1.0000 | Freq: Once | CUTANEOUS | Status: AC
  Administered 2023-05-14: 1 via TOPICAL
  Filled 2023-05-14: qty 11

## 2023-05-14 NOTE — ED Triage Notes (Signed)
Patient arrives ambulatory with cane by POV c/o rectal pain- states she had knee surgery October 11th and is now having issues with constipation. Has been taking stool softeners and laxatives. Reports 5 days since last BM.

## 2023-05-14 NOTE — Discharge Instructions (Signed)
As we discussed, you have stool impaction.  I recommend you take MiraLAX twice daily and stay hydrated.  Please avoid taking narcotics when possible  See your doctor for follow-up  Return to ER if you have severe abdominal pain or vomiting

## 2023-05-14 NOTE — ED Provider Notes (Signed)
[facility] EMERGENCY DEPARTMENT AT [facility] Provider Note   CSN: [id number] Arrival date & time: 05/14/23  1548     History  Chief Complaint  Patient presents with   Constipation    Leslie Mccormick is a 52 y.o. female history of meniscus tear status post right knee replacement several weeks ago here presenting with constipation.  Patient states that she recently had a knee replacement and has been on opiates.  She states that tried stool softeners and senna and Colace and only had small amount of bowel movement.  Patient states that she has some rectal pain.  She felt that she had a stool ball in her rectal vault.  The history is provided by the patient.       Home Medications Prior to Admission medications   Medication Sig Start Date End Date Taking? Authorizing Provider  Ascorbic Acid (VITAMIN C PO) Take by mouth.    [provider]  betamethasone, augmented, (DIPROLENE) 0.05 % lotion Apply topically. 12/08/20   [provider]  Ciclopirox 1 % shampoo Apply topically. 12/12/20   [provider]  Collagen-Vitamin C-Biotin (COLLAGEN 1500/C PO) Take by mouth.    [provider]  fluticasone Aleda Grana) 50 MCG/ACT nasal spray SMARTSIG:1-2 Spray(s) Both Nares Daily 03/24/22   [provider]  loratadine (CLARITIN) 10 MG tablet Take 10 mg by mouth daily.    [provider]  MAGNESIUM PO Take by mouth.    [provider]  Multiple Vitamins-Minerals (ZINC PO) Take by mouth.    [provider]  norethindrone (MICRONOR) 0.35 MG tablet Take 1 tablet (0.35 mg total) by mouth daily. 05/08/22   Genia Del, MD  Omega-3 Fatty Acids (FISH OIL) 1000 MG CAPS Take by mouth.    [provider]  PREVIDENT 5000 BOOSTER PLUS 1.1 % PSTE SMARTSIG:Sparingly To Teeth 04/03/21   [provider]  Semaglutide-Weight Management (WEGOVY) 0.25 MG/0.5ML SOAJ Inject 0.25 mg under the skin once weekly 07/10/22      Semaglutide-Weight Management (WEGOVY) 0.5 MG/0.5ML SOAJ Inject 0.5 mg under the skin once weekly 07/10/22     Semaglutide-Weight Management (WEGOVY) 1 MG/0.5ML SOAJ Inject 1 mg under the skin once weekly 07/10/22         Allergies    Oxycodone-acetaminophen and Tyloxapol    Review of Systems   Review of Systems  Gastrointestinal:  Positive for constipation.  All other systems reviewed and are negative.   Physical Exam Updated Vital Signs BP 132/85 (BP Location: Right Arm)   Pulse 100   Temp 98.6 F (37 C) (Oral)   Resp 18   Ht 5' 1.75" (1.568 m)   Wt 92.5 kg   SpO2 97%   BMI 37.61 kg/m  Physical Exam Vitals and nursing note reviewed.  Constitutional:      Comments: Uncomfortable  HENT:     Head: Normocephalic.     Nose: Nose normal.     Mouth/Throat:     Mouth: Mucous membranes are moist.  Eyes:     Extraocular Movements: Extraocular movements intact.     Pupils: Pupils are equal, round, and reactive to light.  Cardiovascular:     Rate and Rhythm: Normal rate and regular rhythm.     Pulses: Normal pulses.     Heart sounds: Normal heart sounds.  Pulmonary:     Effort: Pulmonary effort is normal.     Breath sounds: Normal breath sounds.  Abdominal:     General: Abdomen is  flat.     Comments: Distended and mild left lower quadrant tenderness  Genitourinary:    Comments: Rectal- stool impaction  Musculoskeletal:     Cervical back: Normal range of motion and neck supple.  Skin:    General: Skin is warm.     Capillary Refill: Capillary refill takes less than 2 seconds.  Neurological:     General: No focal deficit present.     Mental Status: She is oriented to person, place, and time.  Psychiatric:        Mood and Affect: Mood normal.        Behavior: Behavior normal.     ED Results / Procedures / Treatments   Labs (all labs ordered are listed, but only abnormal results are displayed) Labs Reviewed  CBC WITH DIFFERENTIAL/PLATELET  COMPREHENSIVE  METABOLIC PANEL  HCG, SERUM, QUALITATIVE  LIPASE, BLOOD    EKG None  Radiology No results found.  Procedures Procedures    Medications Ordered in ED Medications  morphine (PF) 4 MG/ML injection 4 mg (has no administration in time range)  sodium phosphate (FLEET) enema 1 enema (has no administration in time range)  lidocaine (XYLOCAINE) 2 % jelly 1 Application (has no administration in time range)    ED Course/ Medical Decision Making/ A&P                                 Medical Decision Making Leslie Mccormick is a 52 y.o. female here presenting with abdominal distention and stool impaction.  Patient likely has ileus versus constipation from opiates.  Plan to check basic labs and will disimpact patient   7:27 PM I was able to give enema and patient had a large bowel movement afterwards.  White blood cell count is 14 but chemistry otherwise unremarkable.  X-ray showed constipation.  At this point patient is stable for discharge.  Recommend MiraLAX twice daily for a week  Problems Addressed: Constipation, unspecified constipation type: acute illness or injury Fecal impaction [facility]): acute illness or injury  Amount and/or Complexity of Data Reviewed Labs: ordered. Decision-making details documented in ED Course. Radiology: ordered and independent interpretation performed. Decision-making details documented in ED Course.  Risk OTC drugs. Prescription drug management.    Final Clinical Impression(s) / ED Diagnoses Final diagnoses:  None    Rx / DC Orders ED Discharge Orders     None         Charlynne Pander, MD 05/14/23 Serena Croissant

## 2023-05-16 ENCOUNTER — Ambulatory Visit: Payer: BC Managed Care – PPO | Admitting: Obstetrics and Gynecology

## 2023-06-28 HISTORY — PX: KNEE SURGERY: SHX244

## 2023-07-08 ENCOUNTER — Other Ambulatory Visit: Payer: Self-pay | Admitting: Obstetrics & Gynecology

## 2023-07-08 NOTE — Telephone Encounter (Signed)
Medication refill request: micronor Last AEX:  05-08-22 Next AEX: not scheduled. Message sent to scheduling dept to call patient Last MMG (if hormonal medication request): 07-06-22 birads 1:neg Refill authorized: please approve or deny appropriate

## 2023-08-03 ENCOUNTER — Other Ambulatory Visit: Payer: Self-pay | Admitting: Obstetrics and Gynecology

## 2023-08-08 ENCOUNTER — Other Ambulatory Visit: Payer: Self-pay | Admitting: Family Medicine

## 2023-08-08 ENCOUNTER — Ambulatory Visit
Admission: RE | Admit: 2023-08-08 | Discharge: 2023-08-08 | Disposition: A | Discharge status: Home / Self Care | Payer: 59 | Source: Ambulatory Visit | Attending: Family Medicine | Admitting: Family Medicine

## 2023-08-08 DIAGNOSIS — Z1231 Encounter for screening mammogram for malignant neoplasm of breast: Secondary | ICD-10-CM

## 2023-08-15 ENCOUNTER — Other Ambulatory Visit: Payer: Self-pay | Admitting: Obstetrics and Gynecology

## 2023-08-22 ENCOUNTER — Other Ambulatory Visit: Payer: Self-pay | Admitting: Obstetrics and Gynecology

## 2023-08-22 NOTE — Telephone Encounter (Signed)
Medication refill request: micronor Last AEX:  05-08-22 Next AEX: 09/19/23 Last MMG (if hormonal medication request): 07-06-22 birads 1:neg Refill authorized: please approve or deny appropriate

## 2023-09-17 ENCOUNTER — Other Ambulatory Visit: Payer: Self-pay | Admitting: Obstetrics and Gynecology

## 2023-09-17 NOTE — Telephone Encounter (Signed)
 Med refill request: Micronor 0.35mg  Last AEX: 05/08/22 Next AEX: 09/19/23 Last MMG (if hormonal med) 08/08/23 birads cat 1 neg Refill authorized: Last rx 08/22/23 #28 with 0 refills. Please approve or deny as appropriate.

## 2023-09-19 ENCOUNTER — Encounter: Payer: Self-pay | Admitting: Obstetrics and Gynecology

## 2023-09-19 ENCOUNTER — Ambulatory Visit (INDEPENDENT_AMBULATORY_CARE_PROVIDER_SITE_OTHER): Payer: 59 | Admitting: Obstetrics and Gynecology

## 2023-09-19 VITALS — BP 116/76 | HR 88 | Ht 62.0 in | Wt 187.0 lb

## 2023-09-19 DIAGNOSIS — E2839 Other primary ovarian failure: Secondary | ICD-10-CM | POA: Diagnosis not present

## 2023-09-19 DIAGNOSIS — N951 Menopausal and female climacteric states: Secondary | ICD-10-CM | POA: Diagnosis not present

## 2023-09-19 DIAGNOSIS — R9389 Abnormal findings on diagnostic imaging of other specified body structures: Secondary | ICD-10-CM

## 2023-09-19 DIAGNOSIS — Z1331 Encounter for screening for depression: Secondary | ICD-10-CM

## 2023-09-19 DIAGNOSIS — Z30432 Encounter for removal of intrauterine contraceptive device: Secondary | ICD-10-CM

## 2023-09-19 DIAGNOSIS — Z1211 Encounter for screening for malignant neoplasm of colon: Secondary | ICD-10-CM

## 2023-09-19 LAB — ESTRADIOL: Estradiol: <15 pg/mL

## 2023-09-19 LAB — VITAMIN D 25 HYDROXY (VIT D DEFICIENCY, FRACTURES): Vit D, 25-Hydroxy: 44 ng/mL (ref 30–100)

## 2023-09-19 LAB — FOLLICLE STIMULATING HORMONE: FSH: 69.2 m[IU]/mL

## 2023-09-19 MED ORDER — NORETHINDRONE 0.35 MG PO TABS: 1.0000 | ORAL_TABLET | Freq: Every day | ORAL | 0 refills | Status: DC

## 2023-09-19 MED ORDER — NORETHINDRONE 0.35 MG PO TABS: 1.0000 | ORAL_TABLET | Freq: Every day | ORAL | 11 refills | Status: AC

## 2023-09-19 NOTE — Progress Notes (Signed)
 53 y.o. y.o. female here to discuss hormone changes and desires to continue micronor.  She had her pap smear and annual with an outside provider this year. No LMP recorded. (Menstrual status: Oral contraceptives).    G0 Married.  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     HPI: Well on the Progestin pill.  No PMB x 2 years.  No pelvic pain.  Abstinent. Pap Neg 04/2021.  Pap reflex today. Urine/BMs normal.  Intolerance to Gluten.  Breasts normal. 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 Mammo Neg 08/08/23.  Exercising regularly, elliptical, yoga. Health Labs with Fam MD. Will schedule colono. 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 To get baseline dxa scan. Not on calcium but will start.  Colonoscopy: Fit test in 2023. Due for colonoscopy in 2026  Body mass index is 34.2 kg/m.     09/19/2023    9:32 AM 06/19/2021    2:13 PM  Depression screen PHQ 2/9  Decreased Interest 0 0  Down, Depressed, Hopeless 0 0  PHQ - 2 Score 0 0    Blood pressure 116/76, pulse 88, height 5\' 2"  (1.575 m), weight 187 lb (84.8 kg), SpO2 98%.     Component Value Date/Time   DIAGPAP  05/08/2022 1633    - Negative for Intraepithelial Lesions or Malignancy (NILM)   DIAGPAP - Benign reactive/reparative changes 05/08/2022 1633   DIAGPAP  05/05/2021 1640    - Negative for Intraepithelial Lesions or Malignancy (NILM)   DIAGPAP - Benign reactive/reparative changes 05/05/2021 1640   ADEQPAP  05/08/2022 1633    Satisfactory for evaluation; transformation zone component PRESENT.   ADEQPAP  05/05/2021 1640    Satisfactory for evaluation; transformation zone component PRESENT.    GYN HISTORY:    Component Value Date/Time   DIAGPAP  05/08/2022 1633    - Negative for Intraepithelial Lesions or Malignancy (NILM)   DIAGPAP - Benign reactive/reparative changes 05/08/2022 1633   DIAGPAP  05/05/2021 1640    - Negative for Intraepithelial Lesions or Malignancy  (NILM)   DIAGPAP - Benign reactive/reparative changes 05/05/2021 1640   ADEQPAP  05/08/2022 1633    Satisfactory for evaluation; transformation zone component PRESENT.   ADEQPAP  05/05/2021 1640    Satisfactory for evaluation; transformation zone component PRESENT.    OB History  Gravida Para Term Preterm AB Living  0 0 0 0 0 0  SAB IAB Ectopic Multiple Live Births  0 0 0 0 0    Past Medical History:  Diagnosis Date   Arthritis    in knee   Cancer (HCC)    ovarian cancer    Carpal tunnel syndrome    right hand    Past Surgical History:  Procedure Laterality Date   BREAST BIOPSY Right 03/28/2018   fibrocystic changes   BREAST LUMPECTOMY WITH RADIOACTIVE SEED LOCALIZATION Right 09/14/2020   Procedure: RIGHT BREAST LUMPECTOMY WITH RADIOACTIVE SEED LOCALIZATION;  Surgeon: Griselda Miner, MD;  Location: [facility];  Service: General;  Laterality: Right;   BREAST SURGERY     lumpectomy of left breast    KNEE SURGERY  04/28/2023   Right Knee   KNEE SURGERY  06/28/2023   Left    Current Outpatient Medications on File Prior to Visit  Medication Sig Dispense Refill   Ascorbic Acid (VITAMIN C PO) Take by mouth.     betamethasone, augmented, (DIPROLENE) 0.05 % lotion Apply topically.     Collagen-Vitamin  C-Biotin (COLLAGEN 1500/C PO) Take by mouth.     fluticasone (FLONASE) 50 MCG/ACT nasal spray Place 1-2 sprays into both nostrils daily as needed for allergies or rhinitis.     loratadine (CLARITIN) 10 MG tablet Take 10 mg by mouth daily.     MAGNESIUM PO Take by mouth.     Multiple Vitamins-Minerals (ZINC PO) Take by mouth.     norethindrone (MICRONOR) 0.35 MG tablet Take 1 tablet by mouth once daily 28 tablet 0   Omega-3 Fatty Acids (FISH OIL) 1000 MG CAPS Take by mouth.     PREVIDENT 5000 BOOSTER PLUS 1.1 % PSTE SMARTSIG:Sparingly To Teeth     Semaglutide-Weight Management (WEGOVY) 0.5 MG/0.5ML SOAJ Inject 0.5 mg under the skin once weekly 2 mL 0   No  current facility-administered medications on file prior to visit.    Social History   Socioeconomic History   Marital status: Married    Spouse name: Not on file   Number of children: Not on file   Years of education: Not on file   Highest education level: Not on file  Occupational History   Not on file  Tobacco Use   Smoking status: Never   Smokeless tobacco: Never  Vaping Use   Vaping status: Never Used  Substance and Sexual Activity   Alcohol use: Not Currently   Drug use: Never   Sexual activity: Not Currently    Birth control/protection: Pill    Comment: 1st intercourse- 22, partners- 3, micronor  Other Topics Concern   Not on file  Social History Narrative   Not on file   Social Drivers of Health   Financial Resource Strain: Not on file  Food Insecurity: Not on file  Transportation Needs: Not on file  Physical Activity: Not on file  Stress: Not on file  Social Connections: Not on file  Intimate Partner Violence: Not on file    Family History  Problem Relation Age of Onset   Hypertension Mother    Diabetes Father    Hypertension Father    Cancer Maternal Grandfather        lung    Breast cancer Sister      Allergies  Allergen Reactions   Milk-Related Compounds Other (See Comments)    Affects the stomach   Oxycodone-Acetaminophen Nausea And Vomiting and Other (See Comments)    GI upset   Tyloxapol Nausea And Vomiting      Patient's last menstrual period was No LMP recorded. (Menstrual status: Oral contraceptives)..            Review of Systems Alls systems reviewed and are negative.         A:         Hormone consult                             P:        Patient is pleased with micronor and would like to continue with it. Hot flashes are minimal. To get baseline dxa with abnormal xray finding with degenerative changes at hip. Colonoscopy referral placed for 2026 To get HRT panel today with vit D Encouraged vit and and Calcium  daily. Mammograms annually  15 minutes spent on reviewing records, imaging,  and one on one patient time and counseling patient and documentation Dr. Karma Greaser  No follow-ups on file.  Earley Favor

## 2023-09-23 ENCOUNTER — Encounter: Payer: Self-pay | Admitting: Obstetrics and Gynecology

## 2023-10-01 NOTE — Telephone Encounter (Signed)
 Spoke with patient.  BMD scheduled for 10/02/23.  Patient requesting a CT Scan for prevention.  Patient denies any concerns or symptoms at this time.  Talked with patient to get a better understanding of her request. Patient states she does not want to miss out on any preventative care, thought CT scan would be beneficial. Advised CT scan is order once a problem or concern is identified and needs further imaging. Discussed MMG, BMD, pelvic exams and colonoscopies for prevention/ early screening. Questions answered.   Patient will await BMD results. Once results return she may request OV to further discuss. Advised I will forward to Dr. Karma Greaser to review and f/u with any additional recommendations. Patient agreeable.   Routing to provider for final review. Patient is agreeable to disposition. Will close encounter.

## 2023-10-02 ENCOUNTER — Ambulatory Visit (HOSPITAL_BASED_OUTPATIENT_CLINIC_OR_DEPARTMENT_OTHER)
Admission: RE | Admit: 2023-10-02 | Discharge: 2023-10-02 | Disposition: A | Discharge status: Home / Self Care | Source: Ambulatory Visit | Attending: Obstetrics and Gynecology | Admitting: Obstetrics and Gynecology

## 2023-10-02 DIAGNOSIS — R9389 Abnormal findings on diagnostic imaging of other specified body structures: Secondary | ICD-10-CM | POA: Insufficient documentation

## 2023-10-02 DIAGNOSIS — N951 Menopausal and female climacteric states: Secondary | ICD-10-CM | POA: Diagnosis present

## 2023-10-02 DIAGNOSIS — E2839 Other primary ovarian failure: Secondary | ICD-10-CM | POA: Insufficient documentation

## 2024-07-24 ENCOUNTER — Other Ambulatory Visit: Payer: Self-pay | Admitting: Family Medicine

## 2024-07-24 DIAGNOSIS — Z1231 Encounter for screening mammogram for malignant neoplasm of breast: Secondary | ICD-10-CM

## 2024-08-18 ENCOUNTER — Ambulatory Visit
Admission: RE | Admit: 2024-08-18 | Discharge: 2024-08-18 | Disposition: A | Source: Ambulatory Visit | Attending: Family Medicine | Admitting: Family Medicine

## 2024-08-18 ENCOUNTER — Ambulatory Visit

## 2024-08-18 DIAGNOSIS — Z1231 Encounter for screening mammogram for malignant neoplasm of breast: Secondary | ICD-10-CM

## 2024-09-22 ENCOUNTER — Ambulatory Visit: Payer: 59 | Admitting: Obstetrics and Gynecology
